# Patient Record
Sex: Male | Born: 1995 | Race: Black or African American | Hispanic: No | Marital: Single
Health system: Southern US, Community
[De-identification: ages and names within clinical notes are randomized; demographics above are authoritative.]

## PROBLEM LIST (undated history)

## (undated) DIAGNOSIS — I471 Supraventricular tachycardia, unspecified: Secondary | ICD-10-CM

---
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
## 2014-07-27 ENCOUNTER — Ambulatory Visit (INDEPENDENT_AMBULATORY_CARE_PROVIDER_SITE_OTHER): Payer: BLUE CROSS/BLUE SHIELD

## 2014-07-27 ENCOUNTER — Ambulatory Visit (INDEPENDENT_AMBULATORY_CARE_PROVIDER_SITE_OTHER): Payer: BLUE CROSS/BLUE SHIELD | Admitting: Emergency Medicine

## 2014-07-27 VITALS — BP 122/82 | HR 120 | Temp 98.8°F | Resp 16 | Ht 70.0 in | Wt 147.0 lb

## 2014-07-27 DIAGNOSIS — I471 Supraventricular tachycardia: Secondary | ICD-10-CM

## 2014-07-27 DIAGNOSIS — R1012 Left upper quadrant pain: Secondary | ICD-10-CM

## 2014-07-27 DIAGNOSIS — K5900 Constipation, unspecified: Secondary | ICD-10-CM

## 2014-07-27 LAB — POCT CBC
Granulocyte percent: 50.1 %G (ref 37–80)
HCT, POC: 49.4 % (ref 43.5–53.7)
Hemoglobin: 16 g/dL (ref 14.1–18.1)
Lymph, poc: 1.9 (ref 0.6–3.4)
MCH, POC: 29.3 pg (ref 27–31.2)
MCHC: 32.5 g/dL (ref 31.8–35.4)
MCV: 90.3 fL (ref 80–97)
MID (cbc): 0.3 (ref 0–0.9)
MPV: 6.6 fL (ref 0–99.8)
POC GRANULOCYTE: 2.2 (ref 2–6.9)
POC LYMPH PERCENT: 42.2 %L (ref 10–50)
POC MID %: 7.7 %M (ref 0–12)
Platelet Count, POC: 338 10*3/uL (ref 142–424)
RBC: 5.47 M/uL (ref 4.69–6.13)
RDW, POC: 14.9 %
WBC: 4.4 10*3/uL — AB (ref 4.6–10.2)

## 2014-07-27 LAB — POCT URINALYSIS DIPSTICK
Bilirubin, UA: NEGATIVE
Glucose, UA: NEGATIVE
Ketones, UA: NEGATIVE
Leukocytes, UA: NEGATIVE
Nitrite, UA: NEGATIVE
Protein, UA: NEGATIVE
Spec Grav, UA: <=1.005
Urobilinogen, UA: 0.2
pH, UA: 5

## 2014-07-27 LAB — POCT UA - MICROSCOPIC ONLY
Bacteria, U Microscopic: NEGATIVE
CASTS, UR, LPF, POC: NEGATIVE
Crystals, Ur, HPF, POC: NEGATIVE
Mucus, UA: NEGATIVE
RBC, urine, microscopic: NEGATIVE
WBC, Ur, HPF, POC: NEGATIVE
Yeast, UA: NEGATIVE

## 2014-07-27 MED ORDER — POLYETHYLENE GLYCOL 3350 17 GM/SCOOP PO POWD: 17.0000 g | Freq: Two times a day (BID) | ORAL | Status: DC | PRN

## 2014-07-27 NOTE — Patient Instructions (Signed)

## 2014-07-27 NOTE — Progress Notes (Signed)
[facility] [address], [place] [address] [phone]  Date:  07/27/2014   Name:  Wesley Morales Caswell   DOB:  18-Jan-1996   MRN:  [id number]  PCP:  No primary care provider on file.    Chief Complaint: Abdominal Pain   History of Present Illness:  Wesley Morales Budge is a 19 y.o. very pleasant male patient who presents with the following:  Has a less than one week history of LUQ pain. No precipitating factor. Worse with meals.  No alcohol  Lots of caffeine. Vomiting on Saturday.  No nausea or food intolerance. The patient has no complaint of blood, mucous, or pus in her stools. No vomiting blood.no cough or coryza. No shortness of breath or hemoptysis ArchivistCollege student. Denies other complaint or health concern today.   Patient Active Problem List   Diagnosis Date Noted  . 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 No pertinent past medical history.  History reviewed. No pertinent past surgical history.  History  Substance Use Topics  . Smoking status: Never Smoker   . Smokeless tobacco: Not on file  . Alcohol Use: Not on file    History reviewed. No pertinent family history.  No Known Allergies  Medication list has been reviewed and updated.  No current outpatient prescriptions on file prior to visit.   No current facility-administered medications on file prior to visit.    Review of Systems:  As per HPI, otherwise negative.    Physical Examination: Filed Vitals:   07/27/14 1645  BP: 122/82  Pulse: 120  Temp: 98.8 F (37.1 C)  Resp: 16   Filed Vitals:   07/27/14 1645  Height: 5\' 10"  (1.778 m)  Weight: 147 lb (66.679 kg)   Body mass index is 21.09 kg/(m^2). Ideal Body Weight: Weight in (lb) to have BMI = 25: 173.9  GEN: WDWN, NAD, Non-toxic, A & O x 3 HEENT: Atraumatic, Normocephalic. Neck supple. No masses, No LAD. Ears and Nose: No external deformity. CV: SVT, No M/G/R. No JVD. No thrill. No  extra heart sounds. PULM: CTA B, no wheezes, crackles, rhonchi. No retractions. No resp. distress. No accessory muscle use. ABD: S, NT, ND, +BS. No rebound. No HSM. EXTR: No c/c/e NEURO Normal gait.  PSYCH: Normally interactive. Conversant. Not depressed or anxious appearing.  Calm demeanor.    Assessment and Plan: Abdominal pain Constipation PSVT  Signed,  Phillips OdorJeffery Tomara Youngberg, MD   [facility] reading (PRIMARY) by  Dr. Dareen PianoAnderson:  No SBO or free air.

## 2014-07-28 LAB — COMPREHENSIVE METABOLIC PANEL
ALT: 55 U/L — AB (ref 0–53)
AST: 34 U/L (ref 0–37)
Albumin: 4.7 g/dL (ref 3.5–5.2)
Alkaline Phosphatase: 85 U/L (ref 39–117)
BUN: 9 mg/dL (ref 6–23)
CO2: 27 meq/L (ref 19–32)
Calcium: 9.6 mg/dL (ref 8.4–10.5)
Chloride: 104 mEq/L (ref 96–112)
Creat: 0.9 mg/dL (ref 0.50–1.35)
Glucose, Bld: 77 mg/dL (ref 70–99)
Potassium: 4.4 mEq/L (ref 3.5–5.3)
Sodium: 139 mEq/L (ref 135–145)
Total Bilirubin: 0.4 mg/dL (ref 0.2–1.1)
Total Protein: 7.3 g/dL (ref 6.0–8.3)

## 2014-07-28 LAB — TSH: TSH: 1.466 u[IU]/mL (ref 0.350–4.500)

## 2014-12-20 ENCOUNTER — Ambulatory Visit (INDEPENDENT_AMBULATORY_CARE_PROVIDER_SITE_OTHER): Payer: Commercial Managed Care - PPO | Admitting: Family Medicine

## 2014-12-20 VITALS — BP 128/70 | HR 105 | Temp 98.2°F | Resp 16 | Ht 71.0 in | Wt 146.8 lb

## 2014-12-20 DIAGNOSIS — Z113 Encounter for screening for infections with a predominantly sexual mode of transmission: Secondary | ICD-10-CM

## 2014-12-20 DIAGNOSIS — Z202 Contact with and (suspected) exposure to infections with a predominantly sexual mode of transmission: Secondary | ICD-10-CM

## 2014-12-20 DIAGNOSIS — R300 Dysuria: Secondary | ICD-10-CM

## 2014-12-20 DIAGNOSIS — J3089 Other allergic rhinitis: Secondary | ICD-10-CM

## 2014-12-20 LAB — POCT UA - MICROSCOPIC ONLY
Bacteria, U Microscopic: NEGATIVE
CASTS, UR, LPF, POC: NEGATIVE
CRYSTALS, UR, HPF, POC: NEGATIVE
EPITHELIAL CELLS, URINE PER MICROSCOPY: NEGATIVE
Mucus, UA: NEGATIVE
RBC, urine, microscopic: NEGATIVE
WBC, Ur, HPF, POC: NEGATIVE
Yeast, UA: NEGATIVE

## 2014-12-20 LAB — POCT URINALYSIS DIPSTICK
Bilirubin, UA: NEGATIVE
Blood, UA: NEGATIVE
GLUCOSE UA: NEGATIVE
Ketones, UA: NEGATIVE
Leukocytes, UA: NEGATIVE
NITRITE UA: NEGATIVE
PH UA: 6.5
PROTEIN UA: NEGATIVE
Spec Grav, UA: 1.025
Urobilinogen, UA: 0.2

## 2014-12-20 MED ORDER — TRIAMCINOLONE ACETONIDE 55 MCG/ACT NA AERO: 2.0000 | INHALATION_SPRAY | Freq: Every day | NASAL | Status: DC

## 2014-12-20 MED ORDER — CEFTRIAXONE SODIUM 1 G IJ SOLR
250.0000 mg | Freq: Once | INTRAMUSCULAR | Status: AC
  Administered 2014-12-20: 250 mg via INTRAMUSCULAR

## 2014-12-20 MED ORDER — AZITHROMYCIN 250 MG PO TABS: ORAL_TABLET | ORAL | Status: DC

## 2014-12-20 NOTE — Patient Instructions (Signed)
I will be in touch with your labs asap We treated you for gonorrhea today (shot) and gave you pills to cover chlamydia (azithromycin) Try the nasal spray for your nasal congestion- let me know if not helpful

## 2014-12-20 NOTE — Progress Notes (Addendum)
[facility] [address], [place] [address] [phone]  Date:  12/20/2014   Name:  Wesley Morales   DOB:  [birth year]   MRN:  [id number]  PCP:  No primary care provider on file.    Chief Complaint: Exposure to STD   History of Present Illness:  Wesley Morales is a 19 y.o. very pleasant male patient who presents with the following:  Here today with concern about STI.  He notes some dysuria and a little bit of penile discharge yesterday.   His former partner was unfaithful- he had gonorrhea last year.  He has not had any unprotected vaginal sex but has had oral sex.  Last sexual encounter 1.5 weeks ago He has the dysuria still today but no discharge today He would like to go ahead and be treated for gonorrhea and chlamydia today He is generally healthy   He has a history of PSVT- has had this since childhood. OW healthy  He also notes nasal congestion that bothers him more at night.  He tried some zicam NS but nothing else so far  Patient Active Problem List   Diagnosis Date Noted  . 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 No pertinent past medical history.  History reviewed. No pertinent past surgical history.  History  Substance Use Topics  . Smoking status: Never Smoker   . Smokeless tobacco: Not on file  . Alcohol Use: Not on file    History reviewed. No pertinent family history.  No Known Allergies  Medication list has been reviewed and updated.  Current Outpatient Prescriptions on File Prior to Visit  Medication Sig Dispense Refill  . polyethylene glycol powder (GLYCOLAX/MIRALAX) powder Take 17 g by mouth 2 (two) times daily as needed. (Patient not taking: Reported on 12/20/2014) 3350 g 1   No current facility-administered medications on file prior to visit.    Review of Systems:  As per HPI- otherwise negative.   Physical Examination: Filed Vitals:   12/20/14 1326  BP: 128/70  Pulse: 105   Temp: 98.2 F (36.8 C)  Resp: 16   Filed Vitals:   12/20/14 1326  Height: 5\' 11"  (1.803 m)  Weight: 105 lb (47.628 kg)   Body mass index is 14.65 kg/(m^2). Ideal Body Weight: Weight in (lb) to have BMI = 25: 178.9  GEN: WDWN, NAD, Non-toxic, A & O x 3, slim build, looks well HEENT: Atraumatic, Normocephalic. Neck supple. No masses, No LAD. Ears and Nose: No external deformity. CV: RRR, No M/G/R. No JVD. No thrill. No extra heart sounds. PULM: CTA B, no wheezes, crackles, rhonchi. No retractions. No resp. distress. No accessory muscle use. Denies any genital lesion and declines exam today. EXTR: No c/c/e NEURO Normal gait.  PSYCH: Normally interactive. Conversant. Not depressed or anxious appearing.  Calm demeanor.   Please note weight above is incorrect- corrected in vitals section    Assessment and Plan: STD exposure - Plan: azithromycin (ZITHROMAX) 250 MG tablet, cefTRIAXone (ROCEPHIN) injection 250 mg  Screening for STD (sexually transmitted disease) - Plan: GC/Chlamydia Probe Amp, HIV antibody, Hepatitis B surface antibody, Hepatitis B surface antigen, Hepatitis C antibody, RPR  Dysuria - Plan: Urine culture, POCT UA - Microscopic Only, POCT urinalysis dipstick  Other allergic rhinitis - Plan: triamcinolone (NASACORT) 55 MCG/ACT AERO nasal inhaler  Treat for possible gonorrhea/ chlamydia exposure as above Other labs pending Will be in touch with him asap Reminded not to have intercourse until  we get his results in  Meds ordered this encounter  Medications  . azithromycin (ZITHROMAX) 250 MG tablet    Sig: Take 4 pills (1gram) once    Dispense:  4 tablet    Refill:  0  . cefTRIAXone (ROCEPHIN) injection 250 mg    Sig:     Order Specific Question:  Antibiotic Indication:    Answer:  STD  . triamcinolone (NASACORT) 55 MCG/ACT AERO nasal inhaler    Sig: Place 2 sprays into the nose daily.    Dispense:  1 Inhaler    Refill:  12    Signed Abbe Amsterdam,  MD Received labs 7/13, called and let him know. He states that he is feeling a lot better.  Advised that he is not immune to hep b, can be immunized against this at his convenience   Results for orders placed or performed in visit on 12/20/14  GC/Chlamydia Probe Amp  Result Value Ref Range   CT Probe RNA NEGATIVE    GC Probe RNA NEGATIVE   HIV antibody  Result Value Ref Range   HIV 1&2 Ab, 4th Generation NONREACTIVE NONREACTIVE  Hepatitis B surface antibody  Result Value Ref Range   Hepatitis B-Post 0.2 mIU/mL  Hepatitis B surface antigen  Result Value Ref Range   Hepatitis B Surface Ag NEGATIVE NEGATIVE  Hepatitis C antibody  Result Value Ref Range   HCV Ab NEGATIVE NEGATIVE  RPR  Result Value Ref Range   RPR Ser Ql NON REAC NON REAC  POCT UA - Microscopic Only  Result Value Ref Range   WBC, Ur, HPF, POC neg    RBC, urine, microscopic neg    Bacteria, U Microscopic neg    Mucus, UA neg    Epithelial cells, urine per micros neg    Crystals, Ur, HPF, POC neg    Casts, Ur, LPF, POC neg    Yeast, UA neg   POCT urinalysis dipstick  Result Value Ref Range   Color, UA yellow    Clarity, UA clear    Glucose, UA neg    Bilirubin, UA neg    Ketones, UA neg    Spec Grav, UA 1.025    Blood, UA neg    pH, UA 6.5    Protein, UA neg    Urobilinogen, UA 0.2    Nitrite, UA neg    Leukocytes, UA Negative Negative

## 2014-12-21 LAB — HEPATITIS B SURFACE ANTIGEN: HEP B S AG: NEGATIVE

## 2014-12-21 LAB — RPR

## 2014-12-21 LAB — HEPATITIS C ANTIBODY: HCV Ab: NEGATIVE

## 2014-12-21 LAB — HEPATITIS B SURFACE ANTIBODY, QUANTITATIVE: HEPATITIS B-POST: 0.2 m[IU]/mL

## 2014-12-21 LAB — HIV ANTIBODY (ROUTINE TESTING W REFLEX): HIV 1&2 Ab, 4th Generation: NONREACTIVE

## 2014-12-21 LAB — GC/CHLAMYDIA PROBE AMP
CT PROBE, AMP APTIMA: NEGATIVE
GC PROBE AMP APTIMA: NEGATIVE

## 2014-12-22 LAB — URINE CULTURE
Colony Count: NO GROWTH
ORGANISM ID, BACTERIA: NO GROWTH

## 2016-11-06 ENCOUNTER — Emergency Department (HOSPITAL_BASED_OUTPATIENT_CLINIC_OR_DEPARTMENT_OTHER): Payer: Commercial Managed Care - PPO

## 2016-11-06 ENCOUNTER — Emergency Department (HOSPITAL_BASED_OUTPATIENT_CLINIC_OR_DEPARTMENT_OTHER)
Admission: EM | Admit: 2016-11-06 | Discharge: 2016-11-06 | Disposition: A | Discharge status: Home / Self Care | Payer: Commercial Managed Care - PPO | Attending: Emergency Medicine | Admitting: Emergency Medicine

## 2016-11-06 ENCOUNTER — Encounter (HOSPITAL_BASED_OUTPATIENT_CLINIC_OR_DEPARTMENT_OTHER): Payer: Self-pay | Admitting: *Deleted

## 2016-11-06 DIAGNOSIS — R079 Chest pain, unspecified: Secondary | ICD-10-CM | POA: Diagnosis present

## 2016-11-06 DIAGNOSIS — R000 Tachycardia, unspecified: Secondary | ICD-10-CM | POA: Diagnosis not present

## 2016-11-06 DIAGNOSIS — E860 Dehydration: Secondary | ICD-10-CM | POA: Insufficient documentation

## 2016-11-06 HISTORY — DX: Supraventricular tachycardia, unspecified: I47.10

## 2016-11-06 HISTORY — DX: Supraventricular tachycardia: I47.1

## 2016-11-06 LAB — BASIC METABOLIC PANEL
Anion gap: 11 (ref 5–15)
BUN: 14 mg/dL (ref 6–20)
CHLORIDE: 101 mmol/L (ref 101–111)
CO2: 27 mmol/L (ref 22–32)
CREATININE: 0.86 mg/dL (ref 0.61–1.24)
Calcium: 9.6 mg/dL (ref 8.9–10.3)
GFR calc Af Amer: >60 mL/min (ref >60)
GFR calc non Af Amer: >60 mL/min (ref >60)
Glucose, Bld: 100 mg/dL — ABNORMAL HIGH (ref 65–99)
Potassium: 4.7 mmol/L (ref 3.5–5.1)
Sodium: 139 mmol/L (ref 135–145)

## 2016-11-06 LAB — CBC
HEMATOCRIT: 47.1 % (ref 39.0–52.0)
HEMOGLOBIN: 16.6 g/dL (ref 13.0–17.0)
MCH: 30.5 pg (ref 26.0–34.0)
MCHC: 35.2 g/dL (ref 30.0–36.0)
MCV: 86.4 fL (ref 78.0–100.0)
Platelets: 372 10*3/uL (ref 150–400)
RBC: 5.45 MIL/uL (ref 4.22–5.81)
RDW: 13.4 % (ref 11.5–15.5)
WBC: 8.9 10*3/uL (ref 4.0–10.5)

## 2016-11-06 LAB — TROPONIN I: Troponin I: <0.03 ng/mL (ref <0.03)

## 2016-11-06 LAB — D-DIMER, QUANTITATIVE: D-Dimer, Quant: 0.29 ug/mL-FEU (ref 0.00–0.50)

## 2016-11-06 LAB — TSH: TSH: 0.412 u[IU]/mL (ref 0.350–4.500)

## 2016-11-06 MED ORDER — SODIUM CHLORIDE 0.9 % IV BOLUS (SEPSIS)
1000.0000 mL | Freq: Once | INTRAVENOUS | Status: AC
  Administered 2016-11-06: 1000 mL via INTRAVENOUS

## 2016-11-06 NOTE — ED Notes (Signed)
ED Provider at bedside. 

## 2016-11-06 NOTE — ED Notes (Signed)
Patient transported to X-ray 

## 2016-11-06 NOTE — ED Notes (Signed)
Pt on monitor 

## 2016-11-06 NOTE — Discharge Instructions (Signed)
Please call Dr. Prescott GumBensimhon's office to schedule a follow up appointment from today's visit. If you develop new or worsening symptoms including fever, chills, worsening shortness of breath, numbness, or tingling, please return to the Emergency Department for re-evaluation.

## 2016-11-06 NOTE — ED Provider Notes (Signed)
[facility]   CSN: [id number] Arrival date & time: 11/06/16  1541  By signing my name below, I, Modena Jansky, attest that this documentation has been prepared under the direction and in the presence of non-physician practitioner, Frederik Pear, PA-C. Electronically Signed: Modena Jansky, Scribe. 11/06/2016. 4:12 PM.  History   Chief Complaint Chief Complaint  Patient presents with  . Chest Pain   The history is provided by the patient. No language interpreter was used.   HPI Comments: Wesley Morales is a 21 y.o. male with a PMHx of SVT who presents to the Emergency Department complaining of intermittent moderate left-sided chest pain that started about 3 hours ago. He states he started having a cough (productive of mucus), SOB (exertional), and rhinorrhea a few days ago. He was prescribed Norel AD without improvement. No medication taken PTA today. His chest pain is relieved by laying down and exacerbated by nothing. He describes the pain as a sharp sensation with pressure. He reports he has been on Digoxin for his SVT 6-7 years but was taken off medication for the past few years after the problem resolved. He reports associated chills, heart palpitations (intermittent, currently improved), and headache. He had 2 episodes of vomiting today and his nausea is currently improved. He reports intermittent cramping left thigh pain, lasting 1-2 minutes, which has now improved. Last BM: yesterday. Denies any prior hx of similar complaint, hx of PE/DVT, family hx of PE/DVT, leg swelling, abdominal pain, dysuria, rash, or other complaints at this time.  Cardiologist: Dr. Ace Gins   Past Medical History:  Diagnosis Date  . SVT (supraventricular tachycardia) [facility])     Patient Active Problem List   Diagnosis Date Noted  . 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 No pertinent surgical history.   Home Medications    Prior to Admission  medications   Medication Sig Start Date End Date Taking? Authorizing Provider  Chlorphen-PE-Acetaminophen (NOREL AD) 4-10-325 MG TABS Take by mouth.   Yes [provider]    Family History No family history on file.  Social History Social History  Substance Use Topics  . Smoking status: Never Smoker  . Smokeless tobacco: Not on file  . Alcohol use No     Allergies   Patient has no known allergies.   Review of Systems Review of Systems  Constitutional: Negative for activity change.  HENT: Positive for rhinorrhea.   Respiratory: Positive for cough (Productive) and shortness of breath.   Cardiovascular: Positive for chest pain and palpitations. Negative for leg swelling.  Gastrointestinal: Positive for nausea and vomiting. Negative for abdominal pain.  Genitourinary: Negative for dysuria.  Musculoskeletal: Positive for myalgias (Left thigh). Negative for back pain.  Skin: Negative for rash.  Neurological: Positive for headaches.     Physical Exam Updated Vital Signs BP 135/86   Pulse (!) 108   Temp 98.5 F (36.9 C)   Resp 16   Ht 5\' 11"  (1.803 m)   Wt 155 lb (70.3 kg)   SpO2 99%   BMI 21.62 kg/m   Physical Exam  Constitutional: He appears well-developed.  HENT:  Head: Normocephalic.  Tongue is dry.   Eyes: Conjunctivae are normal.  Neck: Neck supple.  Cardiovascular: Regular rhythm.  Tachycardia present.  Exam reveals no gallop and no friction rub.   No murmur heard. Prominent PMI.   Pulmonary/Chest: Effort normal and breath sounds normal. No respiratory distress. He has no wheezes. He has no rales. He  exhibits no tenderness.  Abdominal: Soft. Bowel sounds are normal. He exhibits no distension. There is no tenderness.  Musculoskeletal: He exhibits no edema or tenderness.  No BLE edema.   Neurological: He is alert.  Skin: Skin is warm and dry. Capillary refill takes 2 to 3 seconds.  Psychiatric: His behavior is normal.  Nursing note and vitals  reviewed.    ED Treatments / Results  DIAGNOSTIC STUDIES: Oxygen Saturation is 99% on RA, normal by my interpretation.    COORDINATION OF CARE: 4:16 PM- Pt advised of plan for treatment and pt agrees.  Labs (all labs ordered are listed, but only abnormal results are displayed) Labs Reviewed  BASIC METABOLIC PANEL - Abnormal; Notable for the following:       Result Value   Glucose, Bld 100 (*)    All other components within normal limits  TROPONIN I  CBC  D-DIMER, QUANTITATIVE (NOT AT [facility])  TSH    EKG  EKG Interpretation  Date/Time:  Wednesday Nov 06 2016 15:51:26 EDT Ventricular Rate:  106 PR Interval:    QRS Duration: 94 QT Interval:  340 QTC Calculation: 452 R Axis:   71 Text Interpretation:  Sinus tachycardia Consider right atrial enlargement No significant change since last tracing Confirmed by Gwyneth SproutPlunkett, Whitney ([id number]) on 11/06/2016 4:33:00 PM       Radiology Dg Chest 2 View  Result Date: 11/06/2016 CLINICAL DATA:  Chest pain.  Cough EXAM: CHEST  2 VIEW COMPARISON:  07/27/2014 FINDINGS: The heart size and mediastinal contours are within normal limits. Both lungs are clear. The visualized skeletal structures are unremarkable. IMPRESSION: No active cardiopulmonary disease. Electronically Signed   By: Signa Kellaylor  Stroud M.D.   On: 11/06/2016 17:26    Procedures Procedures (including critical care time)  Medications Ordered in ED Medications  sodium chloride 0.9 % bolus 1,000 mL (0 mLs Intravenous Stopped 11/06/16 1752)  sodium chloride 0.9 % bolus 1,000 mL (0 mLs Intravenous Stopped 11/06/16 1932)     Initial Impression / Assessment and Plan / ED Course  I have reviewed the triage vital signs and the nursing notes.  Pertinent labs & imaging results that were available during my care of the patient were reviewed by me and considered in my medical decision making (see chart for details).     Patient is to be discharged with recommendation to follow up with PCP  and Cardiology in regards to today's hospital visit. D-dimer is not elevated. VSS, no tracheal deviation, no JVD or new murmur, RRR, breath sounds equal bilaterally, EKG with sinus tachycardia, negative troponin, and negative CXR. Pt has been advised to return to the ED if CP becomes exertional, associated with diaphoresis or nausea, radiates to left jaw/arm, worsens or becomes concerning in any way. Patient initially appeared dehydrated and was much improved after IVF, which also improved his heart rate. Pt appears reliable for follow up and is agreeable to discharge.   Case has been discussed with Dr. Anitra LauthPlunkett, attending physician, who agrees with the above plan to discharge.    Final Clinical Impressions(s) / ED Diagnoses   Final diagnoses:  Sinus tachycardia  Dehydration    New Prescriptions Discharge Medication List as of 11/06/2016  7:56 PM    I personally performed the services described in this documentation, which was scribed in my presence. The recorded information has been reviewed and is accurate.     Frederik PearMcDonald, Kyler Lerette A, PA-C 11/08/16 0350    Gwyneth SproutPlunkett, Whitney, MD 11/08/16 [phone]

## 2016-11-06 NOTE — ED Triage Notes (Signed)
Pt c/o chest pressure, SOb and palpitations  x 3 hrs ago

## 2018-08-21 ENCOUNTER — Ambulatory Visit
Admission: EM | Admit: 2018-08-21 | Discharge: 2018-08-21 | Disposition: A | Discharge status: Home / Self Care | Payer: BLUE CROSS/BLUE SHIELD

## 2018-08-21 DIAGNOSIS — R202 Paresthesia of skin: Secondary | ICD-10-CM | POA: Diagnosis not present

## 2018-08-21 NOTE — Discharge Instructions (Signed)
No alarming signs on exam. Continue to monitor for now. Follow up with orthopedic if numbness becomes worse and does not resolve.

## 2018-08-21 NOTE — ED Triage Notes (Signed)
Pt c/o rt pinky tingling and burning while holding his phone. States needs a work note and a paper trail for [employer]. Pt denies injury or pain

## 2018-08-21 NOTE — ED Provider Notes (Signed)
[facility]    CSN: [id number] Arrival date & time: 08/21/18  1347     History   Chief Complaint Chief Complaint  Patient presents with  . Hand Pain    HPI Wesley Morales is a 23 y.o. male.   23 year old male comes in for "tingling" sensation to the lateral side of middle phalanx of the 5th right finger for a few hours. States was using his phone, had his phone propped with his pinky and felt some tingling sensation. Now with burning sensation to the area. Denies injury/trauma. Denies pain. Denies decrease in ROM. States his mother told him to come get an evaluation.      Past Medical History:  Diagnosis Date  . SVT (supraventricular tachycardia) [facility])     Patient Active Problem List   Diagnosis Date Noted  . 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 No pertinent surgical history.     Home Medications    Prior to Admission medications   Medication Sig Start Date End Date Taking? Authorizing Provider  Chlorphen-PE-Acetaminophen (NOREL AD) 4-10-325 MG TABS Take by mouth.    [provider]    Family History No family history on file.  Social History Social History   Tobacco Use  . Smoking status: Current Every Day Smoker  . Smokeless tobacco: Never Used  Substance Use Topics  . Alcohol use: No    Alcohol/week: 0.0 standard drinks  . Drug use: No     Allergies   Patient has no known allergies.   Review of Systems Review of Systems  Reason unable to perform ROS: See HPI as above.     Physical Exam Triage Vital Signs ED Triage Vitals  Enc Vitals Group     BP 08/21/18 1357 (!) 160/100     Pulse Rate 08/21/18 1357 99     Resp 08/21/18 1357 18     Temp 08/21/18 1357 98.5 F (36.9 C)     Temp Source 08/21/18 1357 Oral     SpO2 08/21/18 1357 99 %     Weight --      Height --      Head Circumference --      Peak Flow --      Pain Score 08/21/18 1358 0     Pain Loc --      Pain Edu? --       Excl. in GC? --    No data found.  Updated Vital Signs BP (!) 145/94 (BP Location: Left Arm)   Pulse 99   Temp 98.5 F (36.9 C) (Oral)   Resp 18   SpO2 99%   Physical Exam Constitutional:      General: He is not in acute distress.    Appearance: He is well-developed. He is not diaphoretic.  HENT:     Head: Normocephalic and atraumatic.  Eyes:     Conjunctiva/sclera: Conjunctivae normal.     Pupils: Pupils are equal, round, and reactive to light.  Musculoskeletal:     Comments: No erythema, warmth, swelling, rash, contusion seen. Patient states he feels that the middle phalanx feels different on palpation. No tenderness to palpation of the finger. Full ROM of finger. Strength normal and equal bilaterally. Patient states lateral inner side of middle phalanx has decreased sensation, but with 5/5 everywhere else. Able to distinguish sharp from dull.   Neurological:     Mental Status: He is alert and oriented to person, place, and time.  UC Treatments / Results  Labs (all labs ordered are listed, but only abnormal results are displayed) Labs Reviewed - No data to display  EKG None  Radiology No results found.  Procedures Procedures (including critical care time)  Medications Ordered in UC Medications - No data to display  Initial Impression / Assessment and Plan / UC Course  I have reviewed the triage vital signs and the nursing notes.  Pertinent labs & imaging results that were available during my care of the patient were reviewed by me and considered in my medical decision making (see chart for details).    No alarming signs on exam. Will have patient monitor for now. Return precautions given. Patient expresses understanding and agrees to plan.  Final Clinical Impressions(s) / UC Diagnoses   Final diagnoses:  Paresthesia of finger   ED Prescriptions    None        Belinda Fisher, PA-C 08/21/18 1421

## 2018-11-30 ENCOUNTER — Ambulatory Visit: Payer: BC Managed Care – PPO | Admitting: Pharmacist

## 2018-11-30 ENCOUNTER — Telehealth: Payer: Self-pay | Admitting: Pharmacy Technician

## 2018-11-30 NOTE — Telephone Encounter (Signed)
[facility] Patient Advocate Encounter    Findings of the benefits investigation conducted this morning via test claims for the patient's upcoming appointment on 11/30/2018 are as follows:   Insurance: BC[employer]- active commercial Estimated copay amount: [phone] (tested with [employer]) Prior Authorization: not required for this medication Deductible: after this claim $2109.08 remains  [facility] Patient Advocate will follow up once patient arrives for their appointment and obtain a copayment card to cover the cost of the medication.  Bartholomew Crews, CPhT Specialty Pharmacy Patient [facility] Phone: [phone] Fax: [phone] 11/30/2018 8:53 AM

## 2018-12-07 ENCOUNTER — Telehealth: Payer: Self-pay | Admitting: Pharmacist

## 2018-12-07 NOTE — Telephone Encounter (Signed)
COVID-19 Pre-Screening Questions: ° °Do you currently have a fever (>100 °F), chills or unexplained body aches? no  ° °Are you currently experiencing new cough, shortness of breath, sore throat, runny nose?no  °•  °Have you recently travelled outside the state of [place] in the last 14 days? no °•  °Have you been in contact with someone that is currently pending confirmation of Covid19 testing or has been confirmed to have the Covid19 virus? no °

## 2018-12-08 ENCOUNTER — Ambulatory Visit: Payer: BC Managed Care – PPO | Admitting: Pharmacist

## 2018-12-08 ENCOUNTER — Telehealth: Payer: Self-pay | Admitting: Pharmacy Technician

## 2018-12-08 NOTE — Telephone Encounter (Signed)
[facility] Patient Teacher, English as a foreign language completed.    The patient is insured through [employer] [facility] [facility] [employer] and has a $1540.08 copay.  We will continue to follow to see if copay assistance is needed.  Venida Jarvis. Nadara Mustard [place] Patient [facility] Phone: [phone] Fax:  [phone]

## 2019-04-06 DIAGNOSIS — Z20828 Contact with and (suspected) exposure to other viral communicable diseases: Secondary | ICD-10-CM | POA: Insufficient documentation

## 2019-04-06 DIAGNOSIS — R45851 Suicidal ideations: Secondary | ICD-10-CM | POA: Insufficient documentation

## 2019-04-06 DIAGNOSIS — Z79899 Other long term (current) drug therapy: Secondary | ICD-10-CM | POA: Insufficient documentation

## 2019-04-06 DIAGNOSIS — F329 Major depressive disorder, single episode, unspecified: Secondary | ICD-10-CM | POA: Insufficient documentation

## 2019-04-06 DIAGNOSIS — F1210 Cannabis abuse, uncomplicated: Secondary | ICD-10-CM | POA: Insufficient documentation

## 2019-04-06 DIAGNOSIS — F209 Schizophrenia, unspecified: Secondary | ICD-10-CM | POA: Diagnosis not present

## 2019-04-07 ENCOUNTER — Other Ambulatory Visit: Payer: Self-pay

## 2019-04-07 ENCOUNTER — Emergency Department (HOSPITAL_COMMUNITY)
Admission: EM | Admit: 2019-04-07 | Discharge: 2019-04-07 | Disposition: A | Discharge status: Other Acute Inpatient Hospital | Payer: BC Managed Care – PPO | Source: Home / Self Care | Attending: Emergency Medicine | Admitting: Emergency Medicine

## 2019-04-07 ENCOUNTER — Encounter (HOSPITAL_COMMUNITY): Payer: Self-pay

## 2019-04-07 ENCOUNTER — Encounter (HOSPITAL_COMMUNITY): Payer: Self-pay | Admitting: Emergency Medicine

## 2019-04-07 ENCOUNTER — Inpatient Hospital Stay (HOSPITAL_COMMUNITY)
Admission: AD | Admit: 2019-04-07 | Discharge: 2019-04-09 | DRG: 885 | Disposition: A | Discharge status: Home / Self Care | Payer: BC Managed Care – PPO | Source: Intra-hospital | Attending: Psychiatry | Admitting: Psychiatry

## 2019-04-07 DIAGNOSIS — F17200 Nicotine dependence, unspecified, uncomplicated: Secondary | ICD-10-CM | POA: Diagnosis present

## 2019-04-07 DIAGNOSIS — R4689 Other symptoms and signs involving appearance and behavior: Secondary | ICD-10-CM

## 2019-04-07 DIAGNOSIS — Z23 Encounter for immunization: Secondary | ICD-10-CM | POA: Diagnosis not present

## 2019-04-07 DIAGNOSIS — R45851 Suicidal ideations: Secondary | ICD-10-CM

## 2019-04-07 DIAGNOSIS — F209 Schizophrenia, unspecified: Principal | ICD-10-CM

## 2019-04-07 DIAGNOSIS — F200 Paranoid schizophrenia: Secondary | ICD-10-CM | POA: Diagnosis not present

## 2019-04-07 DIAGNOSIS — G4700 Insomnia, unspecified: Secondary | ICD-10-CM | POA: Diagnosis present

## 2019-04-07 DIAGNOSIS — Z7289 Other problems related to lifestyle: Secondary | ICD-10-CM | POA: Diagnosis not present

## 2019-04-07 DIAGNOSIS — F329 Major depressive disorder, single episode, unspecified: Secondary | ICD-10-CM | POA: Diagnosis present

## 2019-04-07 DIAGNOSIS — Z20828 Contact with and (suspected) exposure to other viral communicable diseases: Secondary | ICD-10-CM | POA: Diagnosis present

## 2019-04-07 LAB — COMPREHENSIVE METABOLIC PANEL
ALT: 28 U/L (ref 0–44)
AST: 28 U/L (ref 15–41)
Albumin: 4.9 g/dL (ref 3.5–5.0)
Alkaline Phosphatase: 56 U/L (ref 38–126)
Anion gap: 11 (ref 5–15)
BUN: 8 mg/dL (ref 6–20)
CO2: 23 mmol/L (ref 22–32)
Calcium: 9.4 mg/dL (ref 8.9–10.3)
Chloride: 103 mmol/L (ref 98–111)
Creatinine, Ser: 0.97 mg/dL (ref 0.61–1.24)
GFR calc Af Amer: >60 mL/min (ref >60)
GFR calc non Af Amer: >60 mL/min (ref >60)
Glucose, Bld: 103 mg/dL — ABNORMAL HIGH (ref 70–99)
Potassium: 3.8 mmol/L (ref 3.5–5.1)
Sodium: 137 mmol/L (ref 135–145)
Total Bilirubin: 0.9 mg/dL (ref 0.3–1.2)
Total Protein: 7.6 g/dL (ref 6.5–8.1)

## 2019-04-07 LAB — RAPID URINE DRUG SCREEN, HOSP PERFORMED
Amphetamines: NOT DETECTED
Barbiturates: NOT DETECTED
Benzodiazepines: NOT DETECTED
Cocaine: NOT DETECTED
Opiates: NOT DETECTED
Tetrahydrocannabinol: POSITIVE — AB

## 2019-04-07 LAB — CBC
HCT: 45.3 % (ref 39.0–52.0)
Hemoglobin: 15.4 g/dL (ref 13.0–17.0)
MCH: 30.6 pg (ref 26.0–34.0)
MCHC: 34 g/dL (ref 30.0–36.0)
MCV: 90.1 fL (ref 80.0–100.0)
Platelets: 361 10*3/uL (ref 150–400)
RBC: 5.03 MIL/uL (ref 4.22–5.81)
RDW: 13.6 % (ref 11.5–15.5)
WBC: 5.4 10*3/uL (ref 4.0–10.5)
nRBC: 0 % (ref 0.0–0.2)

## 2019-04-07 LAB — ETHANOL: Alcohol, Ethyl (B): <10 mg/dL (ref <10)

## 2019-04-07 LAB — RAPID HIV SCREEN (HIV 1/2 AB+AG)
HIV 1/2 Antibodies: NONREACTIVE
HIV-1 P24 Antigen - HIV24: NONREACTIVE

## 2019-04-07 LAB — RPR: RPR Ser Ql: NONREACTIVE

## 2019-04-07 LAB — SARS CORONAVIRUS 2 BY RT PCR (HOSPITAL ORDER, PERFORMED IN ~~LOC~~ HOSPITAL LAB): SARS Coronavirus 2: NEGATIVE

## 2019-04-07 LAB — ACETAMINOPHEN LEVEL: Acetaminophen (Tylenol), Serum: <10 ug/mL — ABNORMAL LOW (ref 10–30)

## 2019-04-07 LAB — SALICYLATE LEVEL: Salicylate Lvl: <7 mg/dL (ref 2.8–30.0)

## 2019-04-07 MED ORDER — HYDROXYZINE HCL 25 MG PO TABS
25.0000 mg | ORAL_TABLET | Freq: Three times a day (TID) | ORAL | Status: DC | PRN
  Administered 2019-04-08: 23:00:00 25 mg via ORAL
  Filled 2019-04-07: qty 1

## 2019-04-07 MED ORDER — IBUPROFEN 200 MG PO TABS: 600.0000 mg | ORAL_TABLET | Freq: Three times a day (TID) | ORAL | Status: DC | PRN

## 2019-04-07 MED ORDER — TRAZODONE HCL 50 MG PO TABS: 50.0000 mg | ORAL_TABLET | Freq: Every evening | ORAL | Status: DC | PRN

## 2019-04-07 MED ORDER — VITAMIN B-1 100 MG PO TABS
100.0000 mg | ORAL_TABLET | Freq: Every day | ORAL | Status: DC
  Administered 2019-04-07: 100 mg via ORAL
  Filled 2019-04-07: qty 1

## 2019-04-07 MED ORDER — LORAZEPAM 2 MG/ML IJ SOLN: 0.0000 mg | Freq: Two times a day (BID) | INTRAMUSCULAR | Status: DC

## 2019-04-07 MED ORDER — LORAZEPAM 2 MG/ML IJ SOLN: 0.0000 mg | Freq: Four times a day (QID) | INTRAMUSCULAR | Status: DC

## 2019-04-07 MED ORDER — THIAMINE HCL 100 MG/ML IJ SOLN: 100.0000 mg | Freq: Every day | INTRAMUSCULAR | Status: DC

## 2019-04-07 MED ORDER — ALUM & MAG HYDROXIDE-SIMETH 200-200-20 MG/5ML PO SUSP: 30.0000 mL | Freq: Four times a day (QID) | ORAL | Status: DC | PRN

## 2019-04-07 MED ORDER — BENZTROPINE MESYLATE 0.5 MG PO TABS
0.5000 mg | ORAL_TABLET | Freq: Two times a day (BID) | ORAL | Status: DC
  Administered 2019-04-07 – 2019-04-09 (×4): 0.5 mg via ORAL
  Filled 2019-04-07 (×10): qty 1

## 2019-04-07 MED ORDER — ALUM & MAG HYDROXIDE-SIMETH 200-200-20 MG/5ML PO SUSP: 30.0000 mL | ORAL | Status: DC | PRN

## 2019-04-07 MED ORDER — INFLUENZA VAC SPLIT QUAD 0.5 ML IM SUSY
0.5000 mL | PREFILLED_SYRINGE | INTRAMUSCULAR | Status: AC
  Administered 2019-04-08: 0.5 mL via INTRAMUSCULAR
  Filled 2019-04-07: qty 0.5

## 2019-04-07 MED ORDER — MAGNESIUM HYDROXIDE 400 MG/5ML PO SUSP: 30.0000 mL | Freq: Every day | ORAL | Status: DC | PRN

## 2019-04-07 MED ORDER — LORAZEPAM 1 MG PO TABS: 0.0000 mg | ORAL_TABLET | Freq: Four times a day (QID) | ORAL | Status: DC

## 2019-04-07 MED ORDER — TRAZODONE HCL 150 MG PO TABS
150.0000 mg | ORAL_TABLET | Freq: Every day | ORAL | Status: DC
  Administered 2019-04-08: 150 mg via ORAL
  Filled 2019-04-07 (×4): qty 1

## 2019-04-07 MED ORDER — OMEGA-3-ACID ETHYL ESTERS 1 G PO CAPS
1.0000 g | ORAL_CAPSULE | Freq: Two times a day (BID) | ORAL | Status: DC
  Administered 2019-04-07 – 2019-04-09 (×4): 1 g via ORAL
  Filled 2019-04-07 (×10): qty 1

## 2019-04-07 MED ORDER — RISPERIDONE 2 MG PO TABS
2.0000 mg | ORAL_TABLET | Freq: Two times a day (BID) | ORAL | Status: DC
  Administered 2019-04-07 – 2019-04-08 (×2): 2 mg via ORAL
  Filled 2019-04-07 (×6): qty 1

## 2019-04-07 MED ORDER — NICOTINE 21 MG/24HR TD PT24
21.0000 mg | MEDICATED_PATCH | Freq: Every day | TRANSDERMAL | Status: DC
  Administered 2019-04-07: 11:00:00 21 mg via TRANSDERMAL
  Filled 2019-04-07: qty 1

## 2019-04-07 MED ORDER — ACETAMINOPHEN 325 MG PO TABS: 650.0000 mg | ORAL_TABLET | Freq: Four times a day (QID) | ORAL | Status: DC | PRN

## 2019-04-07 MED ORDER — ZOLPIDEM TARTRATE 5 MG PO TABS
5.0000 mg | ORAL_TABLET | Freq: Every evening | ORAL | Status: DC | PRN
  Administered 2019-04-07: 5 mg via ORAL
  Filled 2019-04-07: qty 1

## 2019-04-07 MED ORDER — LORAZEPAM 1 MG PO TABS: 0.0000 mg | ORAL_TABLET | Freq: Two times a day (BID) | ORAL | Status: DC

## 2019-04-07 NOTE — ED Notes (Signed)
Transported to [facility] by [employer]. Pt calm and cooperative.

## 2019-04-07 NOTE — BH Assessment (Addendum)
Tele Assessment Note   Patient Name: Wesley Morales MRN: [id number] Referring Physician: Frederik Pear, PA-C Location of Patient: [facility] Location of Provider: Behavioral Health TTS Department  Wesley Morales is an 23 y.o. male presenting under from home via sheriff. Mother reported patient was wandering out in street with no clothes, confused. Patient admitted to [facility] with plan to cut self with knife. Patient reported onset of SI was 2 months ago, unable to identify triggers. Patient owns a pocket knife. Mother reported that patient was just released from a [facility] on 3 weeks ago due to SI. Triage reported patient ran into glass door out in lobby and repeatedly hit head on glass. During assessment patient appeared to be confused and unable to answer questions, continually stating "I have issues, I was upset and tired, I just need to sleep, I have to adjust to everything". Patient unable to share what he needed to adjust to. Patient reported alcohol and marijuana usage. Patient reported seeing [facility] for medication management and outpatient therapy, however patient was not able to share appt times or names. Patient denied prior suicide attempt and reported self-harming behaviors of cutting himself last month. Patient resides with mother and sister. Patient was pleasant and cooperative during assessment.   Diagnosis: Major depressive disorder and Cannabis Use disorder  Past Medical History:  Past Medical History:  Diagnosis Date  . SVT (supraventricular tachycardia) (HCC)     History reviewed. No pertinent surgical history.  Family History: No family history on file.  Social History:  reports that he has been smoking. He has never used smokeless tobacco. He reports that he does not drink alcohol or use drugs.  Additional Social History:  Alcohol / Drug Use Pain Medications: see MAR Prescriptions: see MAR Over the Counter: see MAR  CIWA: CIWA-Ar BP: (!) 136/96 Pulse  Rate: (!) 120 Nausea and Vomiting: no nausea and no vomiting Tactile Disturbances: none Tremor: no tremor Auditory Disturbances: not present Paroxysmal Sweats: no sweat visible Visual Disturbances: not present Anxiety: no anxiety, at ease Headache, Fullness in Head: none present Agitation: normal activity Orientation and Clouding of Sensorium: oriented and can do serial additions CIWA-Ar Total: 0 COWS:    Allergies: No Known Allergies  Home Medications: (Not in a hospital admission)   OB/GYN Status:  No LMP for male patient.  General Assessment Data Location of Assessment: [facility] TTS Assessment: In system Is this a Tele or Face-to-Face Assessment?: Tele Assessment Is this an Initial Assessment or a Re-assessment for this encounter?: Initial Assessment Patient Accompanied by:: N/A Language Other than English: No Living Arrangements: (family home) What gender do you identify as?: Male Marital status: Single Living Arrangements: Parent, Other relatives Can pt return to current living arrangement?: Yes Admission Status: Voluntary Is patient capable of signing voluntary admission?: Yes Referral Source: Self/Family/Friend  Crisis Care Plan Living Arrangements: Parent, Other relatives Legal Guardian: (self) Name of Psychiatrist: ([facility]) Name of Therapist: ([facility])  Education Status Is patient currently in school?: No Is the patient employed, unemployed or receiving disability?: Unemployed  Risk to self with the past 6 months Suicidal Ideation: Yes-Currently Present Has patient been a risk to self within the past 6 months prior to admission? : Yes Suicidal Intent: Yes-Currently Present Has patient had any suicidal intent within the past 6 months prior to admission? : Yes Is patient at risk for suicide?: Yes Suicidal Plan?: Yes-Currently Present Has patient had any suicidal plan within the past 6 months prior to admission? :  Yes Specify Current Suicidal Plan: (SI with  plan to cut self) Access to Means: Yes Specify Access to Suicidal Means: (owns pocket knife) What has been your use of drugs/alcohol within the last 12 months?: (alcohol and marijuana) Previous Attempts/Gestures: No How many times?: (0) Other Self Harm Risks: (none reported) Triggers for Past Attempts: (n/a) Intentional Self Injurious Behavior: Cutting Comment - Self Injurious Behavior: (cut last month) Family Suicide History: No Recent stressful life event(s): (unable to assess) Persecutory voices/beliefs?: No Depression: Yes Depression Symptoms: Tearfulness, Isolating, Fatigue, Guilt, Loss of interest in usual pleasures, Feeling worthless/self pity Substance abuse history and/or treatment for substance abuse?: No Suicide prevention information given to non-admitted patients: Not applicable  Risk to Others within the past 6 months Homicidal Ideation: No Does patient have any lifetime risk of violence toward others beyond the six months prior to admission? : No Thoughts of Harm to Others: No Current Homicidal Intent: No Current Homicidal Plan: No Access to Homicidal Means: No Identified Victim: (n/a) History of harm to others?: No Assessment of Violence: None Noted Violent Behavior Description: (none reported) Does patient have access to weapons?: Yes (Comment)(pocket knife) Criminal Charges Pending?: No Does patient have a court date: No Is patient on probation?: No  Psychosis Hallucinations: None noted Delusions: None noted  Mental Status Report Appearance/Hygiene: Unremarkable Eye Contact: Fair Motor Activity: Freedom of movement Speech: Soft, Slow Level of Consciousness: Drowsy, Sleeping, Quiet/awake Mood: Depressed Affect: Depressed, Appropriate to circumstance Anxiety Level: Moderate Thought Processes: Relevant Judgement: Impaired Orientation: Person, Place, Time, Situation Obsessive Compulsive Thoughts/Behaviors: Moderate(per history)  Cognitive  Functioning Concentration: Good Memory: Recent Intact Is patient IDD: No Insight: Poor Impulse Control: Poor Appetite: Good Have you had any weight changes? : No Change Sleep: Decreased(5-6) Total Hours of Sleep: (5) Vegetative Symptoms: None  ADLScreening [facility] Assessment Services) Patient's cognitive ability adequate to safely complete daily activities?: Yes Patient able to express need for assistance with ADLs?: Yes Independently performs ADLs?: Yes (appropriate for developmental age)  Prior Inpatient Therapy Prior Inpatient Therapy: Yes Prior Therapy Dates: (3 weeks ago) Prior Therapy Facilty/Provider(s): [facility]) Reason for Treatment: (depression)  Prior Outpatient Therapy Prior Outpatient Therapy: No Does patient have an ACCT team?: No Does patient have Intensive In-House Services?  : No Does patient have [facility] services? : No Does patient have P4CC services?: No  ADL Screening (condition at time of admission) Patient's cognitive ability adequate to safely complete daily activities?: Yes Patient able to express need for assistance with ADLs?: Yes Independently performs ADLs?: Yes (appropriate for developmental age)  Regulatory affairs officer (For Healthcare) Does Patient Have a Medical Advance Directive?: No  Child/Adolescent Assessment Running Away Risk: Admits Running Away Risk as evidence by: ranaway today Bed-Wetting: Denies Destruction of Property: Admits Destruction of Porperty As Evidenced By: (moms home) Cruelty to Animals: Denies Stealing: Denies Rebellious/Defies Authority: Science writer as Evidenced By: (does whatever he wants to do) Satanic Involvement: Denies Science writer: Denies  Disposition:  Disposition Initial Assessment Completed for this Encounter: Yes  Talbot Grumbling, NP patient meets inpatient criteria. Per [facility], patient accepted to [place] pending resulted COVID test and medical clearance.   This service was provided via  telemedicine using a 2-way, interactive audio and video technology.  Names of all persons participating in this telemedicine service and their role in this encounter. Name: Wesley Morales Role: Patient  Name: Kirtland Bouchard Role: TTS Clinician  Name:  Role:   Name:  Role:     Venora Maples 04/07/2019 4:06  AM

## 2019-04-07 NOTE — ED Provider Notes (Signed)
[facility] DEPT Provider Note   CSN: [id number] Arrival date & time: 04/06/19  2344     History   Chief Complaint Chief Complaint  Patient presents with  . Medical Clearance    HPI Wesley Morales is a 23 y.o. male who presents to the ER with a chief complaint of erratic behavior and SI.  The patient reports that he was recently discharged from inpatient behavioral health in [place].  States that he was diagnosed with obsessive-compulsive disorder.  He states that until the last 2 to 3 weeks that he has never experienced SI, including prior to his inpatient behavioral health admission in [place].  He denies a specific plan.  He denies HI or auditory or visual hallucinations.  After receiving permission from the patient to receive collateral information from his mother, I spoke to the patient's mother. She states he was admitted [facility] for 5 days about 3-4 weeks after having SI. Seemed to be doing better initially after discharge, but has ulitimately been doing worse the last 2 days.   Reports he hasn't taken his remeron in the last 2 days. Came to mom's house for the first time today in 2.5 weeks.  Initially his behavior seemed appropriate, but he then seemed to "flip a switch" and was acting erratic. She states he was mumbling and talking to himself after mom told him he couldn't vape in the house. Went outside and took off shirt and shoes and took off walking. Mom called 911 to help find him after he took off down the street.  The patient's mother reports that he was living in [place], but he has no place to return to.  He will need to relocate to [facility], but she expresses concern about having him move into her home because the patient's sister has mental health concerns as well and has been doing much better.  She is concerned that the patient's presence will be a negative impact on the patient's sister.  He reports that he vapes  tobacco.  He also reports that he smokes marijuana.  He reports frequent, heavy alcohol use, but does not elaborate on the specific amount.  He denies other illicit or recreational drug use.     The history is provided by the patient. No language interpreter was used.    Past Medical History:  Diagnosis Date  . SVT (supraventricular tachycardia) [facility])     Patient Active Problem List   Diagnosis Date Noted  . 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 No pertinent surgical history.      Home Medications    Prior to Admission medications   Medication Sig Start Date End Date Taking? Authorizing Provider  mirtazapine (REMERON) 15 MG tablet Take 15 mg by mouth at bedtime. 03/23/19  Yes [provider]    Family History No family history on file.  Social History Social History   Tobacco Use  . Smoking status: Current Every Day Smoker  . Smokeless tobacco: Never Used  Substance Use Topics  . Alcohol use: No    Alcohol/week: 0.0 standard drinks  . Drug use: No     Allergies   Patient has no known allergies.   Review of Systems Review of Systems  Constitutional: Negative for appetite change and fever.  Respiratory: Negative for shortness of breath.   Cardiovascular: Negative for chest pain.  Gastrointestinal: Negative for abdominal pain, diarrhea, nausea and vomiting.  Genitourinary: Negative for dysuria.  Musculoskeletal: Negative  for back pain, joint swelling, myalgias and neck stiffness.  Skin: Negative for rash.  Allergic/Immunologic: Negative for immunocompromised state.  Neurological: Negative for headaches.  Psychiatric/Behavioral: Positive for suicidal ideas. Negative for confusion, dysphoric mood, hallucinations and self-injury. The patient is not nervous/anxious and is not hyperactive.      Physical Exam Updated Vital Signs BP 117/62 (BP Location: Left Arm)   Pulse 71   Temp 98 F (36.7 C)   Resp 18   Ht 5'  11" (1.803 m)   Wt 65.8 kg   SpO2 99%   BMI 20.22 kg/m   Physical Exam Vitals signs and nursing note reviewed.  Constitutional:      Appearance: He is well-developed.  HENT:     Head: Normocephalic.  Eyes:     Extraocular Movements: Extraocular movements intact.     Conjunctiva/sclera: Conjunctivae normal.     Pupils: Pupils are equal, round, and reactive to light.     Comments: Mild periorbital swelling of the left eye.  No subconjunctival hemorrhage.  The eye is not injected.  No drainage.  No tenderness to palpation, redness, or warmth.  Neck:     Musculoskeletal: Neck supple.  Cardiovascular:     Rate and Rhythm: Normal rate and regular rhythm.     Pulses: Normal pulses.     Heart sounds: Normal heart sounds. No murmur. No friction rub. No gallop.   Pulmonary:     Effort: Pulmonary effort is normal. No respiratory distress.     Breath sounds: No stridor. No wheezing, rhonchi or rales.  Chest:     Chest wall: No tenderness.  Abdominal:     General: There is no distension.     Palpations: Abdomen is soft. There is no mass.     Tenderness: There is no abdominal tenderness. There is no right CVA tenderness, left CVA tenderness, guarding or rebound.     Hernia: No hernia is present.  Skin:    General: Skin is warm and dry.  Neurological:     Mental Status: He is alert.  Psychiatric:        Attention and Perception: He does not perceive auditory hallucinations.        Mood and Affect: Affect is flat.        Speech: Speech normal.        Behavior: Behavior is slowed.        Thought Content: Thought content includes suicidal ideation. Thought content does not include homicidal ideation. Thought content does not include homicidal or suicidal plan.      ED Treatments / Results  Labs (all labs ordered are listed, but only abnormal results are displayed) Labs Reviewed  COMPREHENSIVE METABOLIC PANEL - Abnormal; Notable for the following components:      Result Value    Glucose, Bld 103 (*)    All other components within normal limits  ACETAMINOPHEN LEVEL - Abnormal; Notable for the following components:   Acetaminophen (Tylenol), Serum <10 (*)    All other components within normal limits  RAPID URINE DRUG SCREEN, HOSP PERFORMED - Abnormal; Notable for the following components:   Tetrahydrocannabinol POSITIVE (*)    All other components within normal limits  SARS CORONAVIRUS 2 BY RT PCR (HOSPITAL ORDER, PERFORMED IN [facility])  ETHANOL  SALICYLATE LEVEL  CBC  RAPID HIV SCREEN (HIV 1/2 AB+AG)  RPR    EKG None  Radiology No results found.  Procedures Procedures (including critical care time)  Medications Ordered in  ED Medications  LORazepam (ATIVAN) injection 0-4 mg ( Intravenous See Alternative 04/07/19 0845)    Or  LORazepam (ATIVAN) tablet 0-4 mg (0 mg Oral Not Given 04/07/19 0845)  LORazepam (ATIVAN) injection 0-4 mg (has no administration in time range)    Or  LORazepam (ATIVAN) tablet 0-4 mg (has no administration in time range)  thiamine (VITAMIN B-1) tablet 100 mg (has no administration in time range)    Or  thiamine (B-1) injection 100 mg (has no administration in time range)  ibuprofen (ADVIL) tablet 600 mg (has no administration in time range)  zolpidem (AMBIEN) tablet 5 mg (5 mg Oral Given 04/07/19 0351)  nicotine (NICODERM CQ - dosed in mg/24 hours) patch 21 mg (has no administration in time range)  alum & mag hydroxide-simeth (MAALOX/MYLANTA) 200-200-20 MG/5ML suspension 30 mL (has no administration in time range)     Initial Impression / Assessment and Plan / ED Course  I have reviewed the triage vital signs and the nursing notes.  Pertinent labs & imaging results that were available during my care of the patient were reviewed by me and considered in my medical decision making (see chart for details).        23 year old male presenting to the ER with erratic behavior and suicidal thoughts.  He recently  completed [facility] for what the patient stated was obsessive-compulsive disorder, but his mother reports was for depression and suicidal ideation.  He was initially tachycardic on arrival to the ER, which is since resolved.  He is remained hemodynamically stable in no acute distress.  He remains voluntary at this time.  He is endorsing suicidal ideation at this time.  No history of previous suicide attempts.  Although the timeline of when the symptoms began is conflicting, it sounds like prior to the last month or so that he has not struggled with suicidal ideation.  UDS is positive for THC.  Ethanol level is not elevated, but the patient does endorse frequent, heavy alcohol use.  CIWA order set has been placed.  Labs are otherwise unremarkable.  TTS has been consulted who recommends inpatient treatment.  COVID-19 has been ordered, which is negative.  After reviewing the patient's chart, it appears that the majority of his outpatient visits have been for STI screenings.  Per chart review, the patient has sex exclusively with male partners.  It does not appear that he has had HIV or syphilis testing in more than 2 years in the setting of acute erratic behavior changes and suicidal ideation that has developed over the last month.  RPR testing is pending.  HIV testing is negative.  Pt medically cleared at this time. Psych hold orders and home med orders placed. Please see psych team notes for further documentation of care/dispo. Pt stable at time of med clearance.     Final Clinical Impressions(s) / ED Diagnoses   Final diagnoses:  Suicidal ideation  Behavioral change    ED Discharge Orders    None       Wesley Boards, PA-C 04/07/19 0858    Nira Conn, MD 04/08/19 [phone]

## 2019-04-07 NOTE — ED Notes (Signed)
Lab called to report HIV results which were non reactive.

## 2019-04-07 NOTE — BH Assessment (Signed)
[facility] [place] [facility] Assessment Progress Note  Per Talbot Grumbling, NP, this pt requires psychiatric hospitalization at this time.  Pt has been assigned pt to [facility] Rm 506-2.  Pt has signed Voluntary Admission and Consent for Treatment, as well as Consent to Release Information to pt's mother and to pt's outpatient provider with [facility], and a notification call has been placed to the latter.  Signed forms have been faxed to [facility].  Pt's nurse, Diane, has been notified, and agrees to send original paperwork along with pt via [employer], and to call report to [phone].  Jalene Mullet, [place] Coordinator [phone]

## 2019-04-07 NOTE — ED Notes (Signed)
COVID test completed, waiting on result. Phlebotomy here to draw HIV test which was just ordered.  Cooperative with both tests.

## 2019-04-07 NOTE — ED Notes (Signed)
Admitted to room 41 as a vol patient. He was bizarre per triage RN report while with her. He is soft spoken, poor eye contact but cooperative. Reports being depressed for several months. He reports thoughts without plan or intention to kill self. He was recently in a [facility] and states they gave him Remeron but he lost it. He was offered and given food and drink on arrival. He has nice manners and so far has been appropriate. Reports difficulty sleeping. Will check with MD for orders.

## 2019-04-07 NOTE — ED Notes (Signed)
Report given to [facility] RN at [facility]. [employer] called.

## 2019-04-07 NOTE — H&P (Signed)
Psychiatric Admission Assessment Adult  Patient Identification: Wesley Morales  MRN:  [id number]  Date of Evaluation:  04/07/2019  Chief Complaint: Wandering outside without clothes on, confused, suicidal with plans to cut himself.  Principal Diagnosis: Schizophrenia ([place])  Diagnosis:  Principal Problem:   Schizophrenia ([place])  History of Present Illness: This is the first psychiatric admission in this [facility] for this 23 year old male with prior hx of mental illness & at least one other previous psychiatric hospitalization at the [facility] in [place], [place] for suicide attempt & mood instability. Wesley Morales is being admitted to the [facility] from the [facility] with complaints of wandering outside his home without clothes, confused & threatening to kill himself by cutting. Chart review indicated that he was recently discharged from the [facility] psychiatric hospital about 3 weeks ago. He was brought to the [facility] for evaluation & treatment.  During this assessment, patient reports, "I have been to the [facility] about 3 weeks ago because I attempted to jump off of my window to kill myself. Then, I decided to tell my mother & grand-mother & they took me to the hospital. I was given Mirtazapine, but, I was not really taking it like I'm suppose to. I missed doses here & there. My mother brought me to the [facility] this time because I was walking outside my home with just my pants on, no shirt or shoes. I wanted to get away from everything & get some rest. I was having bad racing thoughts that started 2 weeks ago after I stopped taking the Mirtazapine. This is my second psychiatric hospital in less than a month. I have been feeling very depressed for over a year. 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 I have been hearing voices in the last 2 weeks. I was told it is because I smoke weed. I don't think it was the weed that was making me hear voices, I smoke  e-cigarettes too & use MDMA occasionally. I'm no longer feeling suicidal".  Associated Signs/Symptoms:  Depression Symptoms:  depressed mood, insomnia, suicidal thoughts with specific plan, anxiety,  (Hypo) Manic Symptoms:  Impulsivity, Labiality of Mood,  Anxiety Symptoms:  Excessive Worry,  Psychotic Symptoms:  Hallucinations: Auditory  PTSD Symptoms: NA  Total Time spent with patient: 1 hour  Past Psychiatric History: Denies any familial hx of mental illness.  Is the patient at risk to self? No.  Has the patient been a risk to self in the past 6 months? Yes.    Has the patient been a risk to self within the distant past? Yes.    Is the patient a risk to others? No.  Has the patient been a risk to others in the past 6 months? No.  Has the patient been a risk to others within the distant past? No.   Prior Inpatient Therapy: Yes, [facility] in [place], [place] Prior Outpatient Therapy: Yes  Alcohol Screening: Yes, I drink occasional alcohol.  Substance Abuse History in the last 12 months:  Yes.    Consequences of Substance Abuse: Medical Consequences:  Liver damage, Possible death by overdose Legal Consequences:  Arrests, jail time, Loss of driving privilege. Family Consequences:  Family discord, divorce and or separation.  Previous Psychotropic Medications: (Remeron)  Psychological Evaluations: No   Past Medical History:  Past Medical History:  Diagnosis Date  . SVT (supraventricular tachycardia) (HCC)    No past surgical history on file.  Family History: No  family history on file.  Family Psychiatric  History: None reported  Tobacco Screening: "I smoke cigarettes & weed"  Social History: Single, no children. Unemployed. Recently moved to [place], [place] area from [place]. Social History   Substance and Sexual Activity  Alcohol Use No  . Alcohol/week: 0.0 standard drinks     Social History   Substance and Sexual Activity  Drug Use No     Additional Social History:  Allergies:  No Known Allergies Lab Results:  Results for orders placed or performed during the hospital encounter of 04/07/19 (from the past 48 hour(s))  Comprehensive metabolic panel     Status: Abnormal   Collection Time: 04/07/19 12:47 AM  Result Value Ref Range   Sodium 137 135 - 145 mmol/L   Potassium 3.8 3.5 - 5.1 mmol/L   Chloride 103 98 - 111 mmol/L   CO2 23 22 - 32 mmol/L   Glucose, Bld 103 (H) 70 - 99 mg/dL   BUN 8 6 - 20 mg/dL   Creatinine, Ser 3.47 0.61 - 1.24 mg/dL   Calcium 9.4 8.9 - 42.5 mg/dL   Total Protein 7.6 6.5 - 8.1 g/dL   Albumin 4.9 3.5 - 5.0 g/dL   AST 28 15 - 41 U/L   ALT 28 0 - 44 U/L   Alkaline Phosphatase 56 38 - 126 U/L   Total Bilirubin 0.9 0.3 - 1.2 mg/dL   GFR calc non Af Amer >60 >60 mL/min   GFR calc Af Amer >60 >60 mL/min   Anion gap 11 5 - 15    Comment: Performed at [facility], [address] [place], [place] [address]  Ethanol     Status: None   Collection Time: 04/07/19 12:47 AM  Result Value Ref Range   Alcohol, Ethyl (B) <10 <10 mg/dL    Comment: (NOTE) Lowest detectable limit for serum alcohol is 10 mg/dL. For medical purposes only. Performed at [facility], [address], [place], [place] [address]   Salicylate level     Status: None   Collection Time: 04/07/19 12:47 AM  Result Value Ref Range   Salicylate Lvl <7.0 2.8 - 30.0 mg/dL    Comment: Performed at [facility], [address] [place], [place] [address]  Acetaminophen level     Status: Abnormal   Collection Time: 04/07/19 12:47 AM  Result Value Ref Range   Acetaminophen (Tylenol), Serum <10 (L) 10 - 30 ug/mL    Comment: (NOTE) Therapeutic concentrations vary significantly. A range of 10-30 ug/mL  may be an effective concentration for many patients. However, some  are best treated at concentrations outside of this range. Acetaminophen concentrations >150 ug/mL at 4 hours after  ingestion  and >50 ug/mL at 12 hours after ingestion are often associated with  toxic reactions. Performed at [facility], [address], [place], [place] [address]   cbc     Status: None   Collection Time: 04/07/19 12:47 AM  Result Value Ref Range   WBC 5.4 4.0 - 10.5 K/uL   RBC 5.03 4.22 - 5.81 MIL/uL   Hemoglobin 15.4 13.0 - 17.0 g/dL   HCT 60.6 30.1 - 60.1 %   MCV 90.1 80.0 - 100.0 fL   MCH 30.6 26.0 - 34.0 pg   MCHC 34.0 30.0 - 36.0 g/dL   RDW 09.3 23.5 - 57.3 %   Platelets 361 150 - 400 K/uL   nRBC 0.0 0.0 - 0.2 %  Comment: Performed at [facility], [address], [place], [place] [address]  Rapid urine drug screen (hospital performed)     Status: Abnormal   Collection Time: 04/07/19 12:47 AM  Result Value Ref Range   Opiates NONE DETECTED NONE DETECTED   Cocaine NONE DETECTED NONE DETECTED   Benzodiazepines NONE DETECTED NONE DETECTED   Amphetamines NONE DETECTED NONE DETECTED   Tetrahydrocannabinol POSITIVE (A) NONE DETECTED   Barbiturates NONE DETECTED NONE DETECTED    Comment: (NOTE) DRUG SCREEN FOR MEDICAL PURPOSES ONLY.  IF CONFIRMATION IS NEEDED FOR ANY PURPOSE, NOTIFY LAB WITHIN 5 DAYS. LOWEST DETECTABLE LIMITS FOR URINE DRUG SCREEN Drug Class                     Cutoff (ng/mL) Amphetamine and metabolites    1000 Barbiturate and metabolites    200 Benzodiazepine                 200 Tricyclics and metabolites     300 Opiates and metabolites        300 Cocaine and metabolites        300 THC                            50 Performed at [facility], [address], [place], [place] [address]   SARS Coronavirus 2 by RT PCR (hospital order, performed in [facility] hospital lab) Nasopharyngeal Nasopharyngeal Swab     Status: None   Collection Time: 04/07/19  5:13 AM   Specimen: Nasopharyngeal Swab  Result Value Ref Range   SARS Coronavirus 2 NEGATIVE NEGATIVE    Comment: (NOTE) If result is  NEGATIVE SARS-CoV-2 target nucleic acids are NOT DETECTED. The SARS-CoV-2 RNA is generally detectable in upper and lower  respiratory specimens during the acute phase of infection. The lowest  concentration of SARS-CoV-2 viral copies this assay can detect is 250  copies / mL. A negative result does not preclude SARS-CoV-2 infection  and should not be used as the sole basis for treatment or other  patient management decisions.  A negative result may occur with  improper specimen collection / handling, submission of specimen other  than nasopharyngeal swab, presence of viral mutation(s) within the  areas targeted by this assay, and inadequate number of viral copies  (<250 copies / mL). A negative result must be combined with clinical  observations, patient history, and epidemiological information. If result is POSITIVE SARS-CoV-2 target nucleic acids are DETECTED. The SARS-CoV-2 RNA is generally detectable in upper and lower  respiratory specimens dur ing the acute phase of infection.  Positive  results are indicative of active infection with SARS-CoV-2.  Clinical  correlation with patient history and other diagnostic information is  necessary to determine patient infection status.  Positive results do  not rule out bacterial infection or co-infection with other viruses. If result is PRESUMPTIVE POSTIVE SARS-CoV-2 nucleic acids MAY BE PRESENT.   A presumptive positive result was obtained on the submitted specimen  and confirmed on repeat testing.  While 2019 novel coronavirus  (SARS-CoV-2) nucleic acids may be present in the submitted sample  additional confirmatory testing may be necessary for epidemiological  and / or clinical management purposes  to differentiate between  SARS-CoV-2 and other Sarbecovirus currently known to infect humans.  If clinically indicated additional testing with an alternate test  methodology [phone]) is advised. The SARS-CoV-2 RNA is generally  detectable  in upper and lower respiratory sp ecimens during the acute  phase of infection. The expected result is Negative. Fact Sheet for Patients:  BoilerBrush.com.cyhttps://www.fda.gov/media/136312/download Fact Sheet for Healthcare Providers: https://pope.com/https://www.fda.gov/media/136313/download This test is not yet approved or cleared by the [place] FDA and has been authorized for detection and/or diagnosis of SARS-CoV-2 by FDA under an Emergency Use Authorization (EUA).  This EUA will remain in effect (meaning this test can be used) for the duration of the COVID-19 declaration under Section 564(b)(1) of the Act, 21 U.S.C. section 360bbb-3(b)(1), unless the authorization is terminated or revoked sooner. Performed at [facility], [address] [place], [place] [address]   RPR     Status: None   Collection Time: 04/07/19  5:55 AM  Result Value Ref Range   RPR Ser Ql NON REACTIVE NON REACTIVE    Comment: Performed at [facility], [address] [place], [place] [address]  Rapid HIV screen (HIV 1/2 Ab+Ag)     Status: None   Collection Time: 04/07/19  5:55 AM  Result Value Ref Range   HIV-1 P24 Antigen - HIV24 NON REACTIVE NON REACTIVE    Comment: (NOTE) Detection of p24 may be inhibited by biotin in the sample, causing false negative results in acute infection.    HIV 1/2 Antibodies NON REACTIVE NON REACTIVE   Interpretation (HIV Ag Ab)      A non reactive test result means that HIV 1 or HIV 2 antibodies and HIV 1 p24 antigen were not detected in the specimen.    Comment: RESULT CALLED TO, READ BACK BY AND VERIFIED WITH: BECK,S. RN AT [phone] 04/07/19 MULLINS,T Performed at [facility], [address] [place], [place] [address]    Blood Alcohol level:  Lab Results  Component Value Date   ETH <10 04/07/2019   Metabolic Disorder Labs:  No results found for: HGBA1C, MPG No results found for: PROLACTIN No results found for: CHOL, TRIG, HDL, CHOLHDL, VLDL,  LDLCALC  Current Medications: No current facility-administered medications for this encounter.    PTA Medications: Medications Prior to Admission  Medication Sig Dispense Refill Last Dose  . mirtazapine (REMERON) 15 MG tablet Take 15 mg by mouth at bedtime.      Musculoskeletal: Strength & Muscle Tone: within normal limits Gait & Station: normal Patient leans: N/A  Psychiatric Specialty Exam: Physical Exam  Nursing note and vitals reviewed. Constitutional: He is oriented to person, place, and time. He appears well-developed.  Eyes: Pupils are equal, round, and reactive to light.  Neck: Normal range of motion.  Cardiovascular: Normal rate and normal heart sounds.  Respiratory: Effort normal.  Genitourinary:    Genitourinary Comments: Deferred   Musculoskeletal: Normal range of motion.  Neurological: He is alert and oriented to person, place, and time.  Skin: Skin is warm and dry.    Review of Systems  Constitutional: Negative for chills and fever.  Respiratory: Negative for cough, shortness of breath and wheezing.   Cardiovascular: Negative for chest pain and palpitations.  Gastrointestinal: Negative for abdominal pain, heartburn, nausea and vomiting.  Musculoskeletal: Negative for myalgias.  Skin: Negative.   Neurological: Negative for dizziness and headaches.  Psychiatric/Behavioral: Positive for depression, hallucinations and substance abuse (UDS (+) for THC). Negative for memory loss and suicidal ideas. The patient has insomnia. The patient is not nervous/anxious.     There were no vitals taken for this visit.There is no height or weight on file to calculate  BMI.  General Appearance: Casual and Fairly Groomed  Eye Contact:  Good  Speech:  Clear and Coherent and Normal Rate  Volume:  Normal  Mood:  Depressed  Affect:  Congruent  Thought Process:  Coherent and Descriptions of Associations: Intact  Orientation:  Full (Time, Place, and Person)  Thought Content:   Rumination  Suicidal Thoughts:  Currently denies any thoughts, plans or intent. Hx. suicidal threats & attempt.  Homicidal Thoughts:  Denies  Memory:  Immediate;   Good Recent;   Good Remote;   Good  Judgement:  Fair  Insight:  Fair  Psychomotor Activity:  Normal  Concentration:  Concentration: Good and Attention Span: Good  Recall:  Good  Fund of Knowledge:  Fair  Language:  Good  Akathisia:  Negative  Handed:  Right  AIMS (if indicated):     Assets:  Communication Skills Desire for Improvement Physical Health Social Support  ADL's:  Intact  Cognition:  WNL  Sleep: New admit   Treatment Plan Summary: Daily contact with patient to assess and evaluate symptoms and progress in treatment and Medication management.  Treatment Plan/Recommendations: 1. Admit for crisis management and stabilization, estimated length of stay 3-5 days.   2. Medication management to reduce current symptoms to base line and improve the patient's overall level of functioning: See MAR, Md's SRA & treatment plan.   Observation Level/Precautions:  15 minute checks  Laboratory:  Per ED, UDS (+) for [facility]  Psychotherapy: Group sessions   Medications: See [facility]    Consultations: As needed.   Discharge Concerns: Safety, mood stability.   Estimated LOS: 5-7 days  Other: Admit to the [address].    Physician Treatment Plan for Primary Diagnosis: Schizophrenia (HCC)  Long Term Goal(s): Improvement in symptoms so as ready for discharge  Short Term Goals: Ability to identify changes in lifestyle to reduce recurrence of condition will improve, Ability to verbalize feelings will improve and Ability to demonstrate self-control will improve  Physician Treatment Plan for Secondary Diagnosis: Principal Problem:   Schizophrenia (HCC)  Long Term Goal(s): Improvement in symptoms so as ready for discharge  Short Term Goals: Ability to identify and develop effective coping behaviors will improve, Compliance with prescribed  medications will improve and Ability to identify triggers associated with substance abuse/mental health issues will improve  I certify that inpatient services furnished can reasonably be expected to improve the patient's condition.    Armandina Stammer, NP, PMHNP, FNP-BC 10/28/20201:38 PM

## 2019-04-07 NOTE — ED Triage Notes (Signed)
Patient here from home via [place]. Mom reports patient was wandering out in street with no clothes, confused. Voluntary. Mom reports that patient was just released from a behavioral facility last week. Patient ran into glass door out in lobby and repeatedly hit head on glass.

## 2019-04-07 NOTE — ED Notes (Signed)
Mia PA saw him earlier this am and put orders in including Ambien for sleep. He is asking for something as he did when he was first admitted to room 41. He just completed his TTS assessment and Probation officer gave him his sleeping aide. He has remained very pleasant and appropriate. When asked what he wants to see happen from here states he just wants to get better, when writer asked him what that would look like states get his focus right.

## 2019-04-07 NOTE — ED Notes (Signed)
Per [facility], patient accepted to [facility] Adult Unit pending COVID screening and medical clearance. Room 506 Bed 2 Attending is Dr. Vinson Moselle, RN, informed of acceptance Arrival time after resulted COVID test.

## 2019-04-07 NOTE — Tx Team (Signed)
Initial Treatment Plan 04/07/2019 7:27 PM Wesley Morales [id number]    PATIENT STRESSORS: Substance abuse   PATIENT STRENGTHS: Ability for insight   PATIENT IDENTIFIED PROBLEMS: SA  psychosis                   DISCHARGE CRITERIA:  Improved stabilization in mood, thinking, and/or behavior  PRELIMINARY DISCHARGE PLAN: Return to previous living arrangement  PATIENT/FAMILY INVOLVEMENT: This treatment plan has been presented to and reviewed with the patient, Wesley Morales, and/or family member,The patient and family have been given the opportunity to ask questions and make suggestions.  Clarita Crane, RN 04/07/2019, 7:27 PM

## 2019-04-07 NOTE — ED Notes (Signed)
Currently speaking with TTS via tele med.

## 2019-04-07 NOTE — [facility] Suicide Risk Assessment (Signed)
[facility] Admission Suicide Risk Assessment   Nursing information obtained from:  Patient Demographic factors:  Male Current Mental Status:  NA Loss Factors:  NA Historical Factors:  Prior suicide attempts Risk Reduction Factors:  NA  Total Time spent with patient: 45 minutes Principal Problem: Schizophrenia ([place]) Diagnosis:  Principal Problem:   Schizophrenia ([place]) Active Problems:   MDD (major depressive disorder)  Subjective Data: admitted for presumed psychosis  Continued Clinical Symptoms:  Alcohol Use Disorder Identification Test Final Score (AUDIT): 0 The "Alcohol Use Disorders Identification Test", Guidelines for Use in Primary Care, Second Edition.  World Pharmacologist [facility]). Score between 0-7:  no or low risk or alcohol related problems. Score between 8-15:  moderate risk of alcohol related problems. Score between 16-19:  high risk of alcohol related problems. Score 20 or above:  warrants further diagnostic evaluation for alcohol dependence and treatment.   CLINICAL FACTORS:   Dysthymia  Musculoskeletal: Strength & Muscle Tone: within normal limits Gait & Station: normal Patient leans: N/A  Psychiatric Specialty Exam: Physical Exam  Nursing note and vitals reviewed. Constitutional: He is oriented to person, place, and time. He appears well-developed.  Eyes: Pupils are equal, round, and reactive to light.  Neck: Normal range of motion.  Cardiovascular: Normal rate and normal heart sounds.  Respiratory: Effort normal.  Genitourinary:    Genitourinary Comments: Deferred   Musculoskeletal: Normal range of motion.  Neurological: He is alert and oriented to person, place, and time.  Skin: Skin is warm and dry.    Review of Systems  Constitutional: Negative for chills and fever.  Respiratory: Negative for cough, shortness of breath and wheezing.   Cardiovascular: Negative for chest pain and palpitations.  Gastrointestinal: Negative for abdominal pain,  heartburn, nausea and vomiting.  Musculoskeletal: Negative for myalgias.  Skin: Negative.   Neurological: Negative for dizziness and headaches.  Psychiatric/Behavioral: Positive for depression, hallucinations and substance abuse (UDS (+) for THC). Negative for memory loss and suicidal ideas. The patient has insomnia. The patient is not nervous/anxious.     There were no vitals taken for this visit.There is no height or weight on file to calculate BMI.  General Appearance: Casual and Fairly Groomed  Eye Contact:  Good  Speech:  Clear and Coherent and Normal Rate  Volume:  Normal  Mood:  Depressed  Affect:  Congruent  Thought Process:  Coherent and Descriptions of Associations: Intact  Orientation:  Full (Time, Place, and Person)  Thought Content:  Rumination  Suicidal Thoughts:  Currently denies any thoughts, plans or intent. Hx. suicidal threats & attempt.  Homicidal Thoughts:  Denies  Memory:  Immediate;   Good Recent;   Good Remote;   Good  Judgement:  Fair  Insight:  Fair  Psychomotor Activity:  Normal  Concentration:  Concentration: Good and Attention Span: Good  Recall:  Good  Fund of Knowledge:  Fair  Language:  Good  Akathisia:  Negative  Handed:  Right  AIMS (if indicated):     Assets:  Communication Skills Desire for Improvement Physical Health Social Support  ADL's:  Intact  Cognition:  WNL  Sleep: New admit      COGNITIVE FEATURES THAT CONTRIBUTE TO RISK:  Loss of executive function    SUICIDE RISK:   Minimal: No identifiable suicidal ideation.  Patients presenting with no risk factors but with morbid ruminations; may be classified as minimal risk based on the severity of the depressive symptoms  PLAN OF CARE: see eval  I certify that  inpatient services furnished can reasonably be expected to improve the patient's condition.   Malvin Johns, MD 04/07/2019, 3:59 PM

## 2019-04-08 DIAGNOSIS — F200 Paranoid schizophrenia: Secondary | ICD-10-CM | POA: Diagnosis not present

## 2019-04-08 MED ORDER — RISPERIDONE 3 MG PO TABS
3.0000 mg | ORAL_TABLET | Freq: Two times a day (BID) | ORAL | Status: DC
  Administered 2019-04-08 – 2019-04-09 (×2): 3 mg via ORAL
  Filled 2019-04-08 (×6): qty 1

## 2019-04-08 NOTE — BHH Suicide Risk Assessment (Cosign Needed)
B[facility] INPATIENT:  Family/Significant Other Suicide Prevention Education  Suicide Prevention Education:  Education Completed; with mother, Wesley Morales, [phone] has been identified by the patient as the family member/significant other with whom the patient will be residing, and identified as the person(s) who will aid the patient in the event of a mental health crisis (suicidal ideations/suicide attempt).  With written consent from the patient, the family member/significant other has been provided the following suicide prevention education, prior to the and/or following the discharge of the patient.  The suicide prevention education provided includes the following:  Suicide risk factors  Suicide prevention and interventions  National Suicide Hotline telephone number  [facility] assessment telephone number  [facility] Emergency Assistance [place] and/or Residential Mobile Crisis Unit telephone number  Request made of family/significant other to:  Remove weapons (e.g., guns, rifles, knives), all items previously/currently identified as safety concern.    Remove drugs/medications (over-the-counter, prescriptions, illicit drugs), all items previously/currently identified as a safety concern.  The family member/significant other verbalizes understanding of the suicide prevention education information provided.  The family member/significant other agrees to remove the items of safety concern listed above.   Pt's mother had questions about pt's diagnoses and medication. Pt's mother also asked about MyChart and getting medical records. Pt's mother also asked for Dr. Rella Larve number to ask more about diagnoses. Pt's mother did state there is a gun in the home but it is safely secure.   Wesley Morales 04/08/2019, 3:21 PM

## 2019-04-08 NOTE — Progress Notes (Signed)
   04/08/19 2300  Psych Admission Type (Psych Patients Only)  Admission Status Voluntary  Psychosocial Assessment  Patient Complaints None  Eye Contact None  Facial Expression Flat  Affect Depressed;Appropriate to circumstance  Speech Soft;Slow  Interaction Minimal  Motor Activity Slow  Appearance/Hygiene Disheveled  Behavior Characteristics Cooperative;Anxious  Mood Depressed  Thought Process  Coherency WDL  Content WDL  Delusions None reported or observed  Perception WDL  Hallucination None reported or observed  Judgment Impaired  Confusion None  Danger to Self  Current suicidal ideation? Denies  Danger to Others  Danger to Others None reported or observed   D: Pt kept to himself much of the evening. Pt stated he was feeling a little better. A: Pt was offered support and encouragement. Pt was given scheduled medications. Pt was encourage to attend groups. Q 15 minute checks were done for safety.  R: safety maintained on unit.
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
## 2019-04-08 NOTE — Progress Notes (Signed)
Patient reports a reduction in voices this shift.  Patient has been compliant with medications this shift.  Patient has had no incidents of behavioral dyscontrol.   Assess patient for safety offer medications as prescribed.   Continue to monitor as planned.

## 2019-04-08 NOTE — Progress Notes (Signed)
[facility] MD Progress Note  04/08/2019 10:58 AM Wesley Morales  MRN:  [id number] Subjective:   Poke with the patient's mother she informs that it probably was cannabis induced paranoia and psychosis patient is apologetic he is recalibrated almost to baseline he is alert and oriented and cooperative.  He explains that he has no hallucinations or paranoia.  Despite recent Cannabis-induced paranoia we want to make sure does not convert to a permanent condition which explained to the patient the role of neuro protection.  Further he elaborates that his suicidal thoughts were just because his "thoughts are racing" and he felt out of sorts and so many words.  Principal Problem: Schizophrenia (HCC) Diagnosis: Principal Problem:   Schizophrenia (HCC) Active Problems:   MDD (major depressive disorder)  Total Time spent with patient: 15 minutes  Past Psychiatric History: w/ aunt x 6 months in [place] - and was not thought to be using until Tues  Past Medical History:  Past Medical History:  Diagnosis Date  . SVT (supraventricular tachycardia) (HCC)    History reviewed. No pertinent surgical history. Family History: History reviewed. No pertinent family history. Family Psychiatric  History: negative acc to mother/Great grandfather undefined mental illness Social History:  Social History   Substance and Sexual Activity  Alcohol Use No  . Alcohol/week: 0.0 standard drinks     Social History   Substance and Sexual Activity  Drug Use No    Social History   Socioeconomic History  . Marital status: Single    Spouse name: Not on file  . Number of children: Not on file  . Years of education: Not on file  . Highest education level: Not on file  Occupational History  . Not on file  Social Needs  . Financial resource strain: Not on file  . Food insecurity    Worry: Not on file    Inability: Not on file  . Transportation needs    Medical: Not on file    Non-medical: Not on file  Tobacco Use  .  Smoking status: Current Every Day Smoker  . Smokeless tobacco: Never Used  Substance and Sexual Activity  . Alcohol use: No    Alcohol/week: 0.0 standard drinks  . Drug use: No  . Sexual activity: Not on file  Lifestyle  . Physical activity    Days per week: Not on file    Minutes per session: Not on file  . Stress: Not on file  Relationships  . Social Musician on phone: Not on file    Gets together: Not on file    Attends religious service: Not on file    Active member of club or organization: Not on file    Attends meetings of clubs or organizations: Not on file    Relationship status: Not on file  Other Topics Concern  . Not on file  Social History Narrative  . Not on file   Additional Social History:                         Sleep: Good  Appetite:  Good  Current Medications: Current Facility-Administered Medications  Medication Dose Route Frequency Provider Last Rate Last Dose  . acetaminophen (TYLENOL) tablet 650 mg  650 mg Oral Q6H PRN Anike, Adaku C, NP      . alum & mag hydroxide-simeth (MAALOX/MYLANTA) 200-200-20 MG/5ML suspension 30 mL  30 mL Oral Q4H PRN Anike, Adaku C, NP      .  benztropine (COGENTIN) tablet 0.5 mg  0.5 mg Oral BID Malvin Johns, MD   0.5 mg at 04/08/19 0825  . hydrOXYzine (ATARAX/VISTARIL) tablet 25 mg  25 mg Oral TID PRN Anike, Adaku C, NP      . influenza vac split quadrivalent PF (FLUARIX) injection 0.5 mL  0.5 mL Intramuscular Tomorrow-1000 Malvin Johns, MD      . magnesium hydroxide (MILK OF MAGNESIA) suspension 30 mL  30 mL Oral Daily PRN Anike, Adaku C, NP      . omega-3 acid ethyl esters (LOVAZA) capsule 1 g  1 g Oral BID Malvin Johns, MD   1 g at 04/08/19 0825  . risperiDONE (RISPERDAL) tablet 2 mg  2 mg Oral BID Malvin Johns, MD   2 mg at 04/08/19 0825  . traZODone (DESYREL) tablet 150 mg  150 mg Oral QHS Malvin Johns, MD        Lab Results:  Results for orders placed or performed during the hospital encounter  of 04/07/19 (from the past 48 hour(s))  Comprehensive metabolic panel     Status: Abnormal   Collection Time: 04/07/19 12:47 AM  Result Value Ref Range   Sodium 137 135 - 145 mmol/L   Potassium 3.8 3.5 - 5.1 mmol/L   Chloride 103 98 - 111 mmol/L   CO2 23 22 - 32 mmol/L   Glucose, Bld 103 (H) 70 - 99 mg/dL   BUN 8 6 - 20 mg/dL   Creatinine, Ser 1.61 0.61 - 1.24 mg/dL   Calcium 9.4 8.9 - 09.6 mg/dL   Total Protein 7.6 6.5 - 8.1 g/dL   Albumin 4.9 3.5 - 5.0 g/dL   AST 28 15 - 41 U/L   ALT 28 0 - 44 U/L   Alkaline Phosphatase 56 38 - 126 U/L   Total Bilirubin 0.9 0.3 - 1.2 mg/dL   GFR calc non Af Amer >60 >60 mL/min   GFR calc Af Amer >60 >60 mL/min   Anion gap 11 5 - 15    Comment: Performed at [facility], [address], [place], [place] [address]  Ethanol     Status: None   Collection Time: 04/07/19 12:47 AM  Result Value Ref Range   Alcohol, Ethyl (B) <10 <10 mg/dL    Comment: (NOTE) Lowest detectable limit for serum alcohol is 10 mg/dL. For medical purposes only. Performed at [facility], [address] [place], [place] [address]   Salicylate level     Status: None   Collection Time: 04/07/19 12:47 AM  Result Value Ref Range   Salicylate Lvl <7.0 2.8 - 30.0 mg/dL    Comment: Performed at [facility], [address] [place], [place] [address]  Acetaminophen level     Status: Abnormal   Collection Time: 04/07/19 12:47 AM  Result Value Ref Range   Acetaminophen (Tylenol), Serum <10 (L) 10 - 30 ug/mL    Comment: (NOTE) Therapeutic concentrations vary significantly. A range of 10-30 ug/mL  may be an effective concentration for many patients. However, some  are best treated at concentrations outside of this range. Acetaminophen concentrations >150 ug/mL at 4 hours after ingestion  and >50 ug/mL at 12 hours after ingestion are often associated with  toxic reactions. Performed at [facility],  [address], [place], [place] [address]   cbc     Status: None   Collection Time: 04/07/19 12:47 AM  Result Value Ref Range   WBC 5.4  4.0 - 10.5 K/uL   RBC 5.03 4.22 - 5.81 MIL/uL   Hemoglobin 15.4 13.0 - 17.0 g/dL   HCT 82.945.3 56.239.0 - 13.052.0 %   MCV 90.1 80.0 - 100.0 fL   MCH 30.6 26.0 - 34.0 pg   MCHC 34.0 30.0 - 36.0 g/dL   RDW 86.513.6 78.411.5 - 69.615.5 %   Platelets 361 150 - 400 K/uL   nRBC 0.0 0.0 - 0.2 %    Comment: Performed at [facility], [address] [place], [place] [address]  Rapid urine drug screen (hospital performed)     Status: Abnormal   Collection Time: 04/07/19 12:47 AM  Result Value Ref Range   Opiates NONE DETECTED NONE DETECTED   Cocaine NONE DETECTED NONE DETECTED   Benzodiazepines NONE DETECTED NONE DETECTED   Amphetamines NONE DETECTED NONE DETECTED   Tetrahydrocannabinol POSITIVE (A) NONE DETECTED   Barbiturates NONE DETECTED NONE DETECTED    Comment: (NOTE) DRUG SCREEN FOR MEDICAL PURPOSES ONLY.  IF CONFIRMATION IS NEEDED FOR ANY PURPOSE, NOTIFY LAB WITHIN 5 DAYS. LOWEST DETECTABLE LIMITS FOR URINE DRUG SCREEN Drug Class                     Cutoff (ng/mL) Amphetamine and metabolites    1000 Barbiturate and metabolites    200 Benzodiazepine                 200 Tricyclics and metabolites     300 Opiates and metabolites        300 Cocaine and metabolites        300 THC                            50 Performed at [facility], [address] [place], [place] [address]   SARS Coronavirus 2 by RT PCR (hospital order, performed in [facility] hospital lab) Nasopharyngeal Nasopharyngeal Swab     Status: None   Collection Time: 04/07/19  5:13 AM   Specimen: Nasopharyngeal Swab  Result Value Ref Range   SARS Coronavirus 2 NEGATIVE NEGATIVE    Comment: (NOTE) If result is NEGATIVE SARS-CoV-2 target nucleic acids are NOT DETECTED. The SARS-CoV-2 RNA is generally detectable in upper and lower  respiratory specimens  during the acute phase of infection. The lowest  concentration of SARS-CoV-2 viral copies this assay can detect is 250  copies / mL. A negative result does not preclude SARS-CoV-2 infection  and should not be used as the sole basis for treatment or other  patient management decisions.  A negative result may occur with  improper specimen collection / handling, submission of specimen other  than nasopharyngeal swab, presence of viral mutation(s) within the  areas targeted by this assay, and inadequate number of viral copies  (<250 copies / mL). A negative result must be combined with clinical  observations, patient history, and epidemiological information. If result is POSITIVE SARS-CoV-2 target nucleic acids are DETECTED. The SARS-CoV-2 RNA is generally detectable in upper and lower  respiratory specimens dur ing the acute phase of infection.  Positive  results are indicative of active infection with SARS-CoV-2.  Clinical  correlation with patient history and other diagnostic information is  necessary to determine patient infection status.  Positive results do  not rule out bacterial infection or co-infection with other viruses. If result is PRESUMPTIVE POSTIVE SARS-CoV-2 nucleic acids MAY BE PRESENT.   A presumptive positive result  was obtained on the submitted specimen  and confirmed on repeat testing.  While 2019 novel coronavirus  (SARS-CoV-2) nucleic acids may be present in the submitted sample  additional confirmatory testing may be necessary for epidemiological  and / or clinical management purposes  to differentiate between  SARS-CoV-2 and other Sarbecovirus currently known to infect humans.  If clinically indicated additional testing with an alternate test  methodology [phone]) is advised. The SARS-CoV-2 RNA is generally  detectable in upper and lower respiratory sp ecimens during the acute  phase of infection. The expected result is Negative. Fact Sheet for Patients:   StrictlyIdeas.no Fact Sheet for Healthcare Providers: BankingDealers.co.za This test is not yet approved or cleared by the [place] FDA and has been authorized for detection and/or diagnosis of SARS-CoV-2 by FDA under an Emergency Use Authorization (EUA).  This EUA will remain in effect (meaning this test can be used) for the duration of the COVID-19 declaration under Section 564(b)(1) of the Act, 21 U.S.C. section 360bbb-3(b)(1), unless the authorization is terminated or revoked sooner. Performed at [facility], [place] [address] [place], [place] [address]   RPR     Status: None   Collection Time: 04/07/19  5:55 AM  Result Value Ref Range   RPR Ser Ql NON REACTIVE NON REACTIVE    Comment: Performed at [facility], [place] [address], [place], [place] [address]  Rapid HIV screen (HIV 1/2 Ab+Ag)     Status: None   Collection Time: 04/07/19  5:55 AM  Result Value Ref Range   HIV-1 P24 Antigen - HIV24 NON REACTIVE NON REACTIVE    Comment: (NOTE) Detection of p24 may be inhibited by biotin in the sample, causing false negative results in acute infection.    HIV 1/2 Antibodies NON REACTIVE NON REACTIVE   Interpretation (HIV Ag Ab)      A non reactive test result means that HIV 1 or HIV 2 antibodies and HIV 1 p24 antigen were not detected in the specimen.    Comment: RESULT CALLED TO, READ BACK BY AND VERIFIED WITH: BECK,S. RN AT [phone] 04/07/19 MULLINS,T Performed at [facility], [place] [address] [place], [place] [address]     Blood Alcohol level:  Lab Results  Component Value Date   ETH <10 81/82/9937    Metabolic Disorder Labs: No results found for: HGBA1C, MPG No results found for: PROLACTIN No results found for: CHOL, TRIG, HDL, CHOLHDL, VLDL, LDLCALC  Physical Findings: AIMS:  , ,  ,  ,    CIWA:    COWS:     Musculoskeletal: Strength & Muscle Tone: within normal limits Gait &  Station: normal Patient leans: N/A  Psychiatric Specialty Exam: Physical Exam  ROS  Blood pressure 129/73, pulse 85, temperature 98.6 F (37 C), temperature source Oral, resp. rate 18, height 5\' 11"  (1.803 m), weight 65.8 kg, SpO2 99 %.Body mass index is 20.22 kg/m.  General Appearance: Casual  Eye Contact:  Good  Speech:  Clear and Coherent  Volume:  Decreased  Mood:  Dysphoric  Affect:  Congruent  Thought Process:  Linear and Descriptions of Associations: Circumstantial  Orientation:  Full (Time, Place, and Person)  Thought Content:  Delusions  Suicidal Thoughts:  No  Homicidal Thoughts:  No  Memory:  Immediate;   Poor Recent;   Fair Remote;   Fair  Judgement:  Fair  Insight:  Fair  Psychomotor Activity:  Normal  Concentration:  Concentration: Good and Attention  Span: Good  Recall:  [employer] of Knowledge:  Fair  Language:  Fair  Akathisia:  Negative  Handed:  Right  AIMS (if indicated):     Assets:  Communication Skills Leisure Time Physical Health Resilience  ADL's:  Intact  Cognition:  WNL  Sleep:  Number of Hours: 9.5   Treatment Plan Summary: Daily contact with patient to assess and evaluate symptoms and progress in treatment and Medication management   Continue low-dose Risperdal continue neuro protection probable discharge tomorrow  Malvin Johns, MD 04/08/2019, 10:58 AM

## 2019-04-08 NOTE — Progress Notes (Signed)
Recreation Therapy Notes  INPATIENT RECREATION THERAPY ASSESSMENT  Patient Details Name: Wesley Morales MRN: [id number] DOB: [birth year] Today's Date: 04/08/2019       Information Obtained From: Patient  Able to Participate in Assessment/Interview: Yes  Patient Presentation: Alert  Reason for Admission (Per Patient): Other (Comments)(Manic episode)  Patient Stressors: Other (Comment), Work(Self, in his own head a lot, finances)  Coping Skills:   Isolation, Journal, TV, Sports, Arguments, Aggression, Music, Exercise, Meditate, Deep Breathing, Substance Abuse, Impulsivity, Art, Prayer, Avoidance, Read, Hot Bath/Shower  Leisure Interests (2+):  Music - Listen, Individual - Other (Comment)(Cooking; Self-reflect)  Frequency of Recreation/Participation: Other (Comment)(Music, Cook- Daily; Self reflect- When he can)  Awareness of Community Resources:  Yes  [employer]:  [place], [place], Engineer, building services  Current Use: Yes  If no, Barriers?:    Expressed Interest in [place]: No  [employer] of Residence:  [place]  Patient Main Form of Transportation: Musician  Patient Strengths:  Hydrologist; Like to drive  Patient Identified Areas of Improvement:  Communication; Singing  Patient Goal for Hospitalization:  "to be a good person, happy person and strong"  Current SI (including self-harm):  No  Current HI:  No  Current AVH: No  Staff Intervention Plan: Group Attendance, Collaborate with Interdisciplinary Treatment Team  Consent to Intern Participation: N/A    Victorino Sparrow, LRT/CTRS  Ria Comment, Zykeriah Mathia A 04/08/2019, 12:12 PM

## 2019-04-08 NOTE — BHH Group Notes (Signed)
[facility] LCSW Group Therapy Note   Date and Time: 04/08/2019 @ 1:30pm  Type of Group and Topic: Psychoeducational Group: Discharge Planning and Individual Wellness  Participation Level: BHH PARTICIPATION LEVEL: None    Description of Group: Discharge planning group reviews patient's anticipated discharge plans and assists patients to anticipate and address any barriers to wellness/recovery in the community. Suicide prevention education is reviewed with patients in group. Therapeutic Goals 1. Patients will state their anticipated discharge plan and mental health aftercare 2. Patients will identify potential barriers to wellness in the community setting 3. Patients will engage in problem solving, solution focused discussion of ways to anticipate and address barriers to wellness/recovery     Summary of Patient Progress:  Patient was engaged throughout group therapy today but did not participate in group today.      Therapeutic Modalities: Motivational Interviewing and Psycho-Education

## 2019-04-08 NOTE — Progress Notes (Signed)
Recreation Therapy Notes  Date: 10.29.20 Time: 1000 Location: [address] Dayroom  Group Topic: Leisure Education  Goal Area(s) Addresses:  Patient will identify positive leisure activities.  Patient will identify one positive benefit of participation in leisure activities.   Behavioral Response: Engaged  Intervention: Leisure Group Game  Activity: Pictionary.  Patients were split into 2 groups.  One person from the group would pick a word from the container and draw it on the board.  Their team has one minute to guess what the picture is.  If they guess correctly, they get the point.  If they don't guess it, the other team gets a chance to steal the point.  Education:  Leisure Education, Dentist  Education Outcome: Acknowledges education/In group clarification offered/Needs additional education  Clinical Observations/Feedback: Pt was appropriate and fully engaged in group activity.  Pt was pleasant and worked well with his peers.  Pt was bright and attentive.    Victorino Sparrow, LRT/CTRS         Victorino Sparrow A 04/08/2019 11:52 AM

## 2019-04-09 DIAGNOSIS — F200 Paranoid schizophrenia: Secondary | ICD-10-CM | POA: Diagnosis not present

## 2019-04-09 MED ORDER — RISPERIDONE 4 MG PO TABS: 4.0000 mg | ORAL_TABLET | Freq: Two times a day (BID) | ORAL | 1 refills | Status: AC

## 2019-04-09 MED ORDER — BENZTROPINE MESYLATE 0.5 MG PO TABS: 0.5000 mg | ORAL_TABLET | Freq: Two times a day (BID) | ORAL | 3 refills | Status: AC

## 2019-04-09 MED ORDER — ENLYTE PO CAPS: ORAL_CAPSULE | ORAL | 11 refills | Status: AC

## 2019-04-09 MED ORDER — OMEGA-3-ACID ETHYL ESTERS 1 G PO CAPS: 1.0000 g | ORAL_CAPSULE | Freq: Two times a day (BID) | ORAL | 6 refills | Status: AC

## 2019-04-09 NOTE — Discharge Summary (Signed)
Physician Discharge Summary Note  Patient:  Wesley Morales is an 23 y.o., male  MRN:  [id number]  DOB:  05-15-1996  Patient phone:  [phone] (home)   Patient address:   [place] [address],   Total Time spent with patient: Greater than 30 minutes  Date of Admission:  04/07/2019  Date of Discharge: 04-09-19  Reason for Admission: Patient was wandering outside his home without clothes on, confused & threatening to kill himself by cutting.  Principal Problem: Schizophrenia [facility])  Discharge Diagnoses: Principal Problem:   Schizophrenia ([place]) Active Problems:   MDD (major depressive disorder)  Past Psychiatric History: Schizophrenia  Past Medical History:  Past Medical History:  Diagnosis Date  . SVT (supraventricular tachycardia) ([place])    History reviewed. No pertinent surgical history.  Family History: History reviewed. No pertinent family history.  Family Psychiatric  History: See H&P  Social History:  Social History   Substance and Sexual Activity  Alcohol Use No  . Alcohol/week: 0.0 standard drinks     Social History   Substance and Sexual Activity  Drug Use No    Social History   Socioeconomic History  . Marital status: Single    Spouse name: Not on file  . Number of children: Not on file  . Years of education: Not on file  . Highest education level: Not on file  Occupational History  . Not on file  Social Needs  . Financial resource strain: Not on file  . Food insecurity    Worry: Not on file    Inability: Not on file  . Transportation needs    Medical: Not on file    Non-medical: Not on file  Tobacco Use  . Smoking status: Current Every Day Smoker  . Smokeless tobacco: Never Used  Substance and Sexual Activity  . Alcohol use: No    Alcohol/week: 0.0 standard drinks  . Drug use: No  . Sexual activity: Not on file  Lifestyle  . Physical activity    Days per week: Not on file    Minutes per session: Not on file   . Stress: Not on file  Relationships  . Social Herbalist on phone: Not on file    Gets together: Not on file    Attends religious service: Not on file    Active member of club or organization: Not on file    Attends meetings of clubs or organizations: Not on file    Relationship status: Not on file  Other Topics Concern  . Not on file  Social History Narrative  . Not on file   Hospital Course: (Per Md's admission evaluation): This is the first psychiatric admission in this [facility] for this 23 year old male with prior hx of mental illness & at least one other previous psychiatric hospitalization at the [facility] in [place], [place] for suicide attempt & mood instability. Chino is being admitted to the [facility] from the [facility] with complaints of wandering outside his home without clothes, confused & threatening to kill himself by cutting. Chart review indicated that he was recently discharged from the [place] psychiatric hospital about 3 weeks ago. He was brought to the [facility] for evaluation & treatment. During this assessment, patient reports, "I have been to the [facility] about 3 weeks ago because I attempted to jump off of my window to kill myself. Then, I decided to tell my mother & grand-mother & they took me to the  hospital. I was given Mirtazapine, but, I was not really taking it like I'm suppose to. I missed doses here & there. My mother brought me to the [facility] this time because I was walking outside my home with just my pants on, no shirt or shoes. I wanted to get away from everything & get some rest. I was having bad racing thoughts that started 2 weeks ago after I stopped taking the Mirtazapine. This is my second psychiatric hospital in less than a month. I have been feeling very depressed for over a year. 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 I have been hearing voices in the last 2 weeks. I was told it is because I smoke  weed. I don't think it was the weed that was making me hear voices, I smoke e-cigarettes too & use MDMA occasionally.   Fallou was admitted to the hospital with his UDS positive THC. However, his reason for admission was he was observed wandering outside his home without clothes on, confused & threatening to kill himself by cutting. He was in need of mood stabilization treatment.   After evaluation of his presenting symptoms, Calyx was started on the medication regimen for his presenting symptoms. He received, stabilized & discharged on the medications as listed on his discharged medication lists below. He was also enrolled  & participated in the group counseling sessions being offered & held on this unit, he learned coping skills that should help him cope better & maintain mood stability after discharge. He presented no other significant pre-existing health issues that required treatment & or monitoring. He tolerated his treatment regimen without any significant adverse effects and or reactions.  During the course of his hospitalizations, Lyonel's symptoms were evaluated on daily basis by a clinical provider to ascertain his symptoms are responding to his treatment regimen. This is evidenced by his reports of decreasing symptoms, improved mood, sleep & presentation of good affect. He is mentally & medically stable. He is being discharged today as he has met the maximum benefit of his treatment here. He will continue mental health care & medication management on na outpatient basis as noted below. He is provided with all the pertinent information required to make this appointment without problems.   On this day of his hospital discharge, Yiannis is in much improved condition than upon admission. He contracted for his safety and felt more in control of his mood. His symptoms were reported as significantly decreased or resolved completely. He denies any SI/HI, AVH, delusional thoughts or paranoia. He is instructed  & motivated to continue taking medications with a goal of continued improvement in mental health. He was able to engage in safety planning including plan to return to [facility] or contact emergency services if he feels unable to maintain his own safety or the safety of others.. He was picked up by his father. He left [facility] in no apparent distress with all belongings.Transportation per mother.  Physical Findings: AIMS:  , ,  ,  ,    CIWA:    COWS:     Musculoskeletal: Strength & Muscle Tone: within normal limits Gait & Station: normal Patient leans: N/A  Psychiatric Specialty Exam: Physical Exam  Nursing note and vitals reviewed. Constitutional: He is oriented to person, place, and time. He appears well-developed.  Cardiovascular: Normal rate.  Respiratory: Effort normal.  Genitourinary:    Genitourinary Comments: Deferred   Musculoskeletal: Normal range of motion.  Neurological: He is alert  and oriented to person, place, and time.  Skin: Skin is warm and dry.    Review of Systems  Constitutional: Negative for chills and fever.  Respiratory: Negative for cough, shortness of breath and wheezing.   Cardiovascular: Negative for chest pain and palpitations.  Gastrointestinal: Negative for heartburn, nausea and vomiting.  Neurological: Negative for dizziness and headaches.  Psychiatric/Behavioral: Positive for hallucinations (Hx. drug induced psychosis (Stabilized with medication prior to discharge)) and substance abuse (Hx. HTC use disorder). Negative for depression, memory loss and suicidal ideas. The patient has insomnia (Stabilized with medication prior to discharge). The patient is not nervous/anxious (Stable).     Blood pressure (!) 107/53, pulse 99, temperature 98.6 F (37 C), temperature source Oral, resp. rate 18, height 5' 11"  (1.803 m), weight 65.8 kg, SpO2 99 %.Body mass index is 20.22 kg/m.  See Md's discharge SRA   Has this patient used any form of tobacco in the last 30 days?  (Cigarettes, Smokeless Tobacco, Cigars, and/or Pipes):  N/A  Blood Alcohol level:  Lab Results  Component Value Date   ETH <10 61/60/7371   Metabolic Disorder Labs:  No results found for: HGBA1C, MPG No results found for: PROLACTIN No results found for: CHOL, TRIG, HDL, CHOLHDL, VLDL, LDLCALC  See Psychiatric Specialty Exam and Suicide Risk Assessment completed by Attending Physician prior to discharge.  Discharge destination:  Home  Is patient on multiple antipsychotic therapies at discharge:  No   Has Patient had three or more failed trials of antipsychotic monotherapy by history:  No  Recommended Plan for Multiple Antipsychotic Therapies: NA  Allergies as of 04/09/2019   No Known Allergies     Medication List    TAKE these medications     Indication  benztropine 0.5 MG tablet Commonly known as: COGENTIN Take 1 tablet (0.5 mg total) by mouth 2 (two) times daily.  Indication: Extrapyramidal Reaction caused by Medications   EnLyte Caps 1 a day maye get from [email or web address]  Indication: 21-Hydroxylase Deficiency   mirtazapine 15 MG tablet Commonly known as: REMERON Take 15 mg by mouth at bedtime.  Indication: Major Depressive Disorder   omega-3 acid ethyl esters 1 g capsule Commonly known as: LOVAZA Take 1 capsule (1 g total) by mouth 2 (two) times daily.  Indication: High Amount of Triglycerides in the Blood   risperidone 4 MG tablet Commonly known as: RISPERDAL Take 1 tablet (4 mg total) by mouth 2 (two) times daily.  Indication: Schizophrenia      Follow-up Information    BEHAVIORAL HEALTH OUTPATIENT THERAPY [place] Follow up on 04/20/2019.   Specialty: Behavioral Health Why: Therapy with Oneita Kras is Tuesday 11/10 at 3:00p.  Medication management with Dr. Mamie Nick is Friday 11/20 at 9:00a.  Appts will be virtual.  Contact information: [place] [phone] [place] [phone]         Follow-up  recommendations: Activity:  As tolerated Diet: As recommended by your primary care doctor. Keep all scheduled follow-up appointments as recommended.  Comments: Prescriptions given at discharge.  Patient agreeable to plan.  Given opportunity to ask questions.  Appears to feel comfortable with discharge denies any current suicidal or homicidal thought. Patient is also instructed prior to discharge to: Take all medications as prescribed by his/her mental healthcare provider. Report any adverse effects and or reactions from the medicines to his/her outpatient provider promptly. Patient has been instructed & cautioned: To not engage in alcohol and or illegal drug use while on  prescription medicines. In the event of worsening symptoms, patient is instructed to call the crisis hotline, 911 and or go to the nearest ED for appropriate evaluation and treatment of symptoms. To follow-up with his/her primary care provider for your other medical issues, concerns and or health care needs.  Signed: Lindell Spar, NP, PMHNP, FNP-BC 04/09/2019, 10:38 AM

## 2019-04-09 NOTE — [facility] Group Notes (Signed)
LCSW Group Therapy Note    Date/Time: 04/09/2019 @ 11:30am  Mood: Did not attend   Type of Therapy and Topic: Group Therapy: Avoiding Self-Sabotaging and Enabling Behaviors  Participation Level: Did Not Attend   Description of Group:  In this group, patients will learn how to identify obstacles, self-sabotaging and enabling behaviors, as well as: what are they, why do we do them and what needs these behaviors meet. Discuss unhealthy relationships and how to have positive healthy boundaries with those that sabotage and enable. Explore aspects of self-sabotage and enabling in yourself and how to limit these self-destructive behaviors in everyday life.   Therapeutic Goals: 1. Patient will identify one obstacle that relates to self-sabotage and enabling behaviors 2. Patient will identify one personal self-sabotaging or enabling behavior they did prior to admission 3. Patient will state a plan to change the above identified behavior 4. Patient will demonstrate ability to communicate their needs through discussion and/or role play.    Summary of Patient Progress:  Patient did not attend group today.     Therapeutic Modalities:  Cognitive Behavioral Therapy Person-Centered Therapy Motivational Interviewing    [place], [place]

## 2019-04-09 NOTE — [facility] Suicide Risk Assessment (Signed)
[facility] Discharge Suicide Risk Assessment   Principal Problem: Schizophrenia [facility]) Discharge Diagnoses: Principal Problem:   Schizophrenia ([place]) Active Problems:   MDD (major depressive disorder)   Total Time spent with patient: 45 minutes  Musculoskeletal: Strength & Muscle Tone: within normal limits Gait & Station: normal Patient leans: N/A  Psychiatric Specialty Exam: ROS  Blood pressure (!) 107/53, pulse 99, temperature 98.6 F (37 C), temperature source Oral, resp. rate 18, height 5\' 11"  (1.803 m), weight 65.8 kg, SpO2 99 %.Body mass index is 20.22 kg/m.  General Appearance: Casual  Eye Contact::  Good  Speech:  Clear and Coherent409  Volume:  Normal  Mood:  Euthymic  Affect:  Constricted  Thought Process:  Coherent and Descriptions of Associations: Circumstantial  Orientation:  Full (Time, Place, and Person)  Thought Content:  Logical  Suicidal Thoughts:  No  Homicidal Thoughts:  No  Memory:  Immediate;   Fair Recent;   Fair Remote;   Fair  Judgement:  Fair  Insight:  Fair  Psychomotor Activity:  Normal  Concentration:  Fair  Recall:  [employer] of Knowledge:Poor  Language: Good  Akathisia:  NA  Handed:  Right  AIMS (if indicated):     Assets:  Communication Skills Desire for Improvement  Sleep:  Number of Hours: 8.75  Cognition: WNL  ADL's:  Intact   Mental Status Per Nursing Assessment::   On Admission:  NA  Demographic Factors:  Male  Loss Factors: Decrease in vocational status  Historical Factors: NA  Risk Reduction Factors:   Living with another person, especially a relative and Positive social support  Continued Clinical Symptoms:  Previous Psychiatric Diagnoses and Treatments  Cognitive Features That Contribute To Risk:  None    Suicide Risk:  Minimal: No identifiable suicidal ideation.  Patients presenting with no risk factors but with morbid ruminations; may be classified as minimal risk based on the severity of the depressive  symptoms    Plan Of Care/Follow-up recommendations:  Activity:  full  Jahmarion Popoff, MD 04/09/2019, 8:19 AM

## 2019-04-09 NOTE — Progress Notes (Signed)
Recreation Therapy Notes  Date: 10.30.20 Time: 1000 Location:  500 [address] Dayroom  Group Topic: Communication, Team Building, Problem Solving  Goal Area(s) Addresses:  Patient will effectively work with peer towards shared goal.  Patient will identify skills used to make activity successful.  Patient will identify how skills used during activity can be used to reach post d/c goals.   Behavioral Response: Engaged  Intervention: STEM Activity  Activity: Straw Bridge.  Patients were divided into groups.  Patients were given 15 straws and a long piece of masking tape.  Patients were to build an elevated bridge that could hold a small puzzle box.  Education:Social Skills, Discharge Planning   Education Outcome: Acknowledges education/In group clarification offered/Needs additional education.   Clinical Observations/Feedback:  Pt was active and engaged.  Pt seemed to take on the leadership role of the group.  Pt listened to the suggestions of his peers.  Pt was appropriate and able to focus on activity.    Victorino Sparrow, LRT/CTRS      Victorino Sparrow A 04/09/2019 12:09 PM

## 2019-04-09 NOTE — Tx Team (Signed)
Interdisciplinary Treatment and Diagnostic Plan Update  04/09/2019 Time of Session: 10:15am Wesley Morales MRN: [id number]  Principal Diagnosis: Schizophrenia [facility])  Secondary Diagnoses: Principal Problem:   Schizophrenia (HCC) Active Problems:   MDD (major depressive disorder)   Current Medications:  Current Facility-Administered Medications  Medication Dose Route Frequency Provider Last Rate Last Dose  . acetaminophen (TYLENOL) tablet 650 mg  650 mg Oral Q6H PRN Anike, Adaku C, NP      . alum & mag hydroxide-simeth (MAALOX/MYLANTA) 200-200-20 MG/5ML suspension 30 mL  30 mL Oral Q4H PRN Anike, Adaku C, NP      . benztropine (COGENTIN) tablet 0.5 mg  0.5 mg Oral BID Malvin Johns, MD   0.5 mg at 04/09/19 0846  . hydrOXYzine (ATARAX/VISTARIL) tablet 25 mg  25 mg Oral TID PRN Anike, Adaku C, NP   25 mg at 04/08/19 2244  . magnesium hydroxide (MILK OF MAGNESIA) suspension 30 mL  30 mL Oral Daily PRN Anike, Adaku C, NP      . omega-3 acid ethyl esters (LOVAZA) capsule 1 g  1 g Oral BID Malvin Johns, MD   1 g at 04/09/19 0846  . risperiDONE (RISPERDAL) tablet 3 mg  3 mg Oral BID Malvin Johns, MD   3 mg at 04/09/19 0846  . traZODone (DESYREL) tablet 150 mg  150 mg Oral QHS Malvin Johns, MD   150 mg at 04/08/19 2243   Current Outpatient Medications  Medication Sig Dispense Refill  . benztropine (COGENTIN) 0.5 MG tablet Take 1 tablet (0.5 mg total) by mouth 2 (two) times daily. 60 tablet 3  . Dietary Management Product (ENLYTE) CAPS 1 a day maye get from [email or web address] 30 capsule 11  . mirtazapine (REMERON) 15 MG tablet Take 15 mg by mouth at bedtime.    Marland Kitchen omega-3 acid ethyl esters (LOVAZA) 1 g capsule Take 1 capsule (1 g total) by mouth 2 (two) times daily. 60 capsule 6  . risperiDONE (RISPERDAL) 4 MG tablet Take 1 tablet (4 mg total) by mouth 2 (two) times daily. 30 tablet 1   PTA Medications: No medications prior to admission.    Patient Stressors: Substance abuse  Patient Strengths:  Ability for insight  Treatment Modalities: Medication Management, Group therapy, Case management,  1 to 1 session with clinician, Psychoeducation, Recreational therapy.   Physician Treatment Plan for Primary Diagnosis: Schizophrenia (HCC) Long Term Goal(s): Improvement in symptoms so as ready for discharge Improvement in symptoms so as ready for discharge   Short Term Goals: Ability to identify changes in lifestyle to reduce recurrence of condition will improve Ability to verbalize feelings will improve Ability to demonstrate self-control will improve Ability to identify and develop effective coping behaviors will improve Compliance with prescribed medications will improve Ability to identify triggers associated with substance abuse/mental health issues will improve  Medication Management: Evaluate patient's response, side effects, and tolerance of medication regimen.  Therapeutic Interventions: 1 to 1 sessions, Unit Group sessions and Medication administration.  Evaluation of Outcomes: Adequate for Discharge  Physician Treatment Plan for Secondary Diagnosis: Principal Problem:   Schizophrenia (HCC) Active Problems:   MDD (major depressive disorder)  Long Term Goal(s): Improvement in symptoms so as ready for discharge Improvement in symptoms so as ready for discharge   Short Term Goals: Ability to identify changes in lifestyle to reduce recurrence of condition will improve Ability to verbalize feelings will improve Ability to demonstrate self-control will improve Ability to identify and develop effective coping behaviors will improve Compliance with  prescribed medications will improve Ability to identify triggers associated with substance abuse/mental health issues will improve     Medication Management: Evaluate patient's response, side effects, and tolerance of medication regimen.  Therapeutic Interventions: 1 to 1 sessions, Unit Group sessions and Medication  administration.  Evaluation of Outcomes: Adequate for Discharge   RN Treatment Plan for Primary Diagnosis: Schizophrenia ([place]) Long Term Goal(s): Knowledge of disease and therapeutic regimen to maintain health will improve  Short Term Goals: Ability to participate in decision making will improve, Ability to verbalize feelings will improve, Ability to disclose and discuss suicidal ideas, Ability to identify and develop effective coping behaviors will improve and Compliance with prescribed medications will improve  Medication Management: RN will administer medications as ordered by provider, will assess and evaluate patient's response and provide education to patient for prescribed medication. RN will report any adverse and/or side effects to prescribing provider.  Therapeutic Interventions: 1 on 1 counseling sessions, Psychoeducation, Medication administration, Evaluate responses to treatment, Monitor vital signs and CBGs as ordered, Perform/monitor CIWA, COWS, AIMS and Fall Risk screenings as ordered, Perform wound care treatments as ordered.  Evaluation of Outcomes: Adequate for Discharge   LCSW Treatment Plan for Primary Diagnosis: Schizophrenia [facility]) Long Term Goal(s): Safe transition to appropriate next level of care at discharge, Engage patient in therapeutic group addressing interpersonal concerns.  Short Term Goals: Engage patient in aftercare planning with referrals and resources and Increase skills for wellness and recovery  Therapeutic Interventions: Assess for all discharge needs, 1 to 1 time with Social worker, Explore available resources and support systems, Assess for adequacy in community support network, Educate family and significant other(s) on suicide prevention, Complete Psychosocial Assessment, Interpersonal group therapy.  Evaluation of Outcomes: Adequate for Discharge   Progress in Treatment: Attending groups: Yes. Participating in groups: No. Taking medication as  prescribed: Yes. Toleration medication: Yes. Family/Significant other contact made: Yes, individual(s) contacted:  pt's mother Patient understands diagnosis: Yes. Discussing patient identified problems/goals with staff: Yes. Medical problems stabilized or resolved: Yes. Denies suicidal/homicidal ideation: Yes. Issues/concerns per patient self-inventory: No. Other:   New problem(s) identified: No, Describe:  None  New Short Term/Long Term Goal(s): Medication stabilization, elimination of SI thoughts, and development of a comprehensive mental wellness plan.   Patient Goals:    Discharge Plan or Barriers: Patient discharged home with his mother and is following with [facility]   Reason for Continuation of Hospitalization: Patient is discharging today.   Estimated Length of Stay: Patient is discharging today.   Attendees: Patient: 04/09/2019   Physician: Dr. Johnn Hai, MD 04/09/2019   Nursing: Louanne Skye, RN 04/09/2019   RN Care Manager: 04/09/2019   Social Worker: Ardelle Anton, LCSW 04/09/2019   Recreational Therapist:  04/09/2019   Other: Ovidio Kin, MSW Intern 04/09/2019   Other:  04/09/2019  Other: 04/09/2019      Scribe for Treatment Team: Trecia Rogers, LCSW 04/09/2019 4:16 PM

## 2019-04-09 NOTE — Plan of Care (Signed)
Patient to be discharged today per MD order 

## 2019-04-09 NOTE — Progress Notes (Signed)
  [facility] Case Management Discharge Plan :  Will you be returning to the same living situation after discharge:  Yes,  home  At discharge, do you have transportation home?: Yes,  pt's mother  Do you have the ability to pay for your medications: Yes,  has blue cross blue shield   Release of information consent forms completed and in the chart;  Patient's signature needed at discharge.  Patient to Follow up at: Follow-up Information    BEHAVIORAL HEALTH OUTPATIENT THERAPY [place] Follow up on 04/20/2019.   Specialty: Behavioral Health Why: Therapy with Oneita Kras is Tuesday 11/10 at 3:00p.  Medication management with Dr. Mamie Nick is Friday 11/20 at 9:00a.  Appts will be virtual.  Contact information: [place] [phone] [place] [phone]          Next level of care provider has access to [place] and Suicide Prevention discussed: Yes,  pt's mother      Has patient been referred to the Quitline?: Yes, faxed on 04/09/2019  Patient has been referred for addiction treatment: Yes  Billey Chang, Student-Social Work 04/09/2019, 9:56 AM

## 2019-04-09 NOTE — Plan of Care (Signed)
Pt was able to show improved communication skills during recreation therapy group session.    Victorino Sparrow, LRT/CTRS

## 2019-04-20 ENCOUNTER — Ambulatory Visit (HOSPITAL_COMMUNITY): Payer: BC Managed Care – PPO | Admitting: Licensed Clinical Social Worker

## 2019-04-20 ENCOUNTER — Other Ambulatory Visit: Payer: Self-pay

## 2019-04-20 ENCOUNTER — Telehealth (HOSPITAL_COMMUNITY): Payer: Self-pay | Admitting: Licensed Clinical Social Worker

## 2019-04-28 ENCOUNTER — Other Ambulatory Visit: Payer: Self-pay

## 2019-04-28 ENCOUNTER — Ambulatory Visit (HOSPITAL_COMMUNITY): Payer: BC Managed Care – PPO | Admitting: Psychiatry

## 2019-04-30 ENCOUNTER — Ambulatory Visit (HOSPITAL_COMMUNITY): Payer: BC Managed Care – PPO | Admitting: Psychiatry

## 2020-01-27 ENCOUNTER — Emergency Department (HOSPITAL_COMMUNITY): Payer: Managed Care, Other (non HMO)

## 2020-01-27 ENCOUNTER — Emergency Department (HOSPITAL_COMMUNITY)
Admission: EM | Admit: 2020-01-27 | Discharge: 2020-02-09 | Disposition: E | Payer: Managed Care, Other (non HMO) | Attending: Emergency Medicine | Admitting: Emergency Medicine

## 2020-01-27 DIAGNOSIS — W3400XA Accidental discharge from unspecified firearms or gun, initial encounter: Secondary | ICD-10-CM

## 2020-01-27 DIAGNOSIS — S066X9A Traumatic subarachnoid hemorrhage with loss of consciousness of unspecified duration, initial encounter: Secondary | ICD-10-CM

## 2020-01-27 DIAGNOSIS — S066X1A Traumatic subarachnoid hemorrhage with loss of consciousness of 30 minutes or less, initial encounter: Secondary | ICD-10-CM | POA: Insufficient documentation

## 2020-01-27 DIAGNOSIS — Y939 Activity, unspecified: Secondary | ICD-10-CM | POA: Insufficient documentation

## 2020-01-27 DIAGNOSIS — Y929 Unspecified place or not applicable: Secondary | ICD-10-CM | POA: Insufficient documentation

## 2020-01-27 DIAGNOSIS — Y999 Unspecified external cause status: Secondary | ICD-10-CM | POA: Insufficient documentation

## 2020-01-27 DIAGNOSIS — R000 Tachycardia, unspecified: Secondary | ICD-10-CM | POA: Diagnosis not present

## 2020-01-27 DIAGNOSIS — S0190XA Unspecified open wound of unspecified part of head, initial encounter: Secondary | ICD-10-CM | POA: Diagnosis not present

## 2020-01-27 DIAGNOSIS — S0990XA Unspecified injury of head, initial encounter: Secondary | ICD-10-CM | POA: Diagnosis present

## 2020-01-27 LAB — I-STAT CHEM 8, ED
BUN: 5 mg/dL — ABNORMAL LOW (ref 6–20)
Calcium, Ion: 1.05 mmol/L — ABNORMAL LOW (ref 1.15–1.40)
Chloride: 103 mmol/L (ref 98–111)
Creatinine, Ser: 0.9 mg/dL (ref 0.61–1.24)
Glucose, Bld: 244 mg/dL — ABNORMAL HIGH (ref 70–99)
HCT: 36 % — ABNORMAL LOW (ref 39.0–52.0)
Hemoglobin: 12.2 g/dL — ABNORMAL LOW (ref 13.0–17.0)
Potassium: 2.9 mmol/L — ABNORMAL LOW (ref 3.5–5.1)
Sodium: 139 mmol/L (ref 135–145)
TCO2: 20 mmol/L — ABNORMAL LOW (ref 22–32)

## 2020-01-27 LAB — CBC
HCT: 36.5 % — ABNORMAL LOW (ref 39.0–52.0)
Hemoglobin: 11.8 g/dL — ABNORMAL LOW (ref 13.0–17.0)
MCH: 30.6 pg (ref 26.0–34.0)
MCHC: 32.3 g/dL (ref 30.0–36.0)
MCV: 94.6 fL (ref 80.0–100.0)
Platelets: 260 10*3/uL (ref 150–400)
RBC: 3.86 MIL/uL — ABNORMAL LOW (ref 4.22–5.81)
RDW: 13.4 % (ref 11.5–15.5)
WBC: 9.1 10*3/uL (ref 4.0–10.5)
nRBC: 0 % (ref 0.0–0.2)

## 2020-01-27 LAB — COMPREHENSIVE METABOLIC PANEL
ALT: 13 U/L (ref 0–44)
AST: 33 U/L (ref 15–41)
Albumin: 3.9 g/dL (ref 3.5–5.0)
Alkaline Phosphatase: 37 U/L — ABNORMAL LOW (ref 38–126)
Anion gap: 13 (ref 5–15)
BUN: 5 mg/dL — ABNORMAL LOW (ref 6–20)
CO2: 18 mmol/L — ABNORMAL LOW (ref 22–32)
Calcium: 8.5 mg/dL — ABNORMAL LOW (ref 8.9–10.3)
Chloride: 105 mmol/L (ref 98–111)
Creatinine, Ser: 1.14 mg/dL (ref 0.61–1.24)
GFR calc Af Amer: >60 mL/min (ref >60)
GFR calc non Af Amer: >60 mL/min (ref >60)
Glucose, Bld: 246 mg/dL — ABNORMAL HIGH (ref 70–99)
Potassium: 2.9 mmol/L — ABNORMAL LOW (ref 3.5–5.1)
Sodium: 136 mmol/L (ref 135–145)
Total Bilirubin: 1 mg/dL (ref 0.3–1.2)
Total Protein: 5.8 g/dL — ABNORMAL LOW (ref 6.5–8.1)

## 2020-01-27 LAB — PROTIME-INR
INR: 1.9 — ABNORMAL HIGH (ref 0.8–1.2)
Prothrombin Time: 21.3 seconds — ABNORMAL HIGH (ref 11.4–15.2)

## 2020-01-27 LAB — ETHANOL: Alcohol, Ethyl (B): <10 mg/dL (ref <10)

## 2020-01-27 LAB — SAMPLE TO BLOOD BANK

## 2020-01-27 LAB — LACTIC ACID, PLASMA: Lactic Acid, Venous: 3.1 mmol/L (ref 0.5–1.9)

## 2020-01-27 MED ORDER — NOREPINEPHRINE BITARTRATE 1 MG/ML IV SOLN: INTRAVENOUS | Status: AC | PRN

## 2020-01-27 MED ORDER — ROCURONIUM BROMIDE 50 MG/5ML IV SOLN
100.0000 mg | Freq: Once | INTRAVENOUS | Status: AC
  Administered 2020-01-27: 100 mg via INTRAVENOUS
  Filled 2020-01-27: qty 10

## 2020-01-27 MED ORDER — EPINEPHRINE 1 MG/10ML IJ SOSY
PREFILLED_SYRINGE | INTRAMUSCULAR | Status: AC | PRN
  Administered 2020-01-27 (×2): 0.5 mg via INTRAVENOUS

## 2020-01-27 MED ORDER — PROPOFOL 1000 MG/100ML IV EMUL
5.0000 ug/kg/min | INTRAVENOUS | Status: DC
  Administered 2020-01-27: 50 ug/kg/min via INTRAVENOUS
  Administered 2020-01-27: 5 ug/kg/min via INTRAVENOUS

## 2020-01-27 MED ORDER — PHENYLEPHRINE HCL-NACL 10-0.9 MG/250ML-% IV SOLN
0.0000 ug/min | INTRAVENOUS | Status: DC
  Administered 2020-01-27: 30 ug/min via INTRAVENOUS

## 2020-01-27 MED ORDER — MIDAZOLAM HCL 5 MG/5ML IJ SOLN
INTRAMUSCULAR | Status: AC | PRN
  Administered 2020-01-27: 2 mg via INTRAVENOUS

## 2020-01-27 MED ORDER — PROPOFOL 1000 MG/100ML IV EMUL
INTRAVENOUS | Status: AC
  Filled 2020-01-27: qty 100

## 2020-01-27 MED ORDER — EPINEPHRINE 1 MG/10ML IJ SOSY
PREFILLED_SYRINGE | INTRAMUSCULAR | Status: AC | PRN
  Administered 2020-01-27: 1 via INTRAVENOUS
  Administered 2020-01-27: .5 via INTRAVENOUS

## 2020-01-27 MED ORDER — SODIUM CHLORIDE 0.9 % IV SOLN
INTRAVENOUS | Status: AC | PRN
  Administered 2020-01-27: 1000 mL via INTRAVENOUS

## 2020-01-27 MED ORDER — PHENYLEPHRINE HCL-NACL 10-0.9 MG/250ML-% IV SOLN
INTRAVENOUS | Status: AC
  Filled 2020-01-27: qty 250

## 2020-01-27 MED ORDER — PHENYLEPHRINE 40 MCG/ML (10ML) SYRINGE FOR IV PUSH (FOR BLOOD PRESSURE SUPPORT)
PREFILLED_SYRINGE | INTRAVENOUS | Status: AC | PRN
  Administered 2020-01-27: 120 ug via INTRAVENOUS
  Administered 2020-01-27: 80 ug via INTRAVENOUS

## 2020-01-27 MED ORDER — FENTANYL CITRATE (PF) 100 MCG/2ML IJ SOLN
INTRAMUSCULAR | Status: AC | PRN
  Administered 2020-01-27: 100 ug via INTRAVENOUS

## 2020-01-27 MED ORDER — PHENYLEPHRINE HCL (PRESSORS) 10 MG/ML IV SOLN: INTRAVENOUS | Status: AC | PRN

## 2020-01-27 NOTE — ED Notes (Signed)
Medical Examiner paged to Dr. Cora Daniels per his request

## 2020-01-27 NOTE — Progress Notes (Signed)
Mother voiced that she no longer wants to give medication to support his blood pressure. Family was informed of the decision.Family present at bedside. Wesley Morales D

## 2020-01-27 NOTE — Progress Notes (Signed)
CDS called. Referral [phone]
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
## 2020-01-27 NOTE — Progress Notes (Signed)
Family at bedside. Dr. Dwain Sarna speaking with family about plan of care. Ripley Fraise D
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
## 2020-01-28 LAB — BPAM RBC
Blood Product Expiration Date: 202109112359
Blood Product Expiration Date: 202109122359
ISSUE DATE / TIME: 202108192040
ISSUE DATE / TIME: 202108192040
Unit Type and Rh: 5100
Unit Type and Rh: 5100

## 2020-01-28 LAB — TYPE AND SCREEN
ABO/RH(D): B POS
Antibody Screen: NEGATIVE
Unit division: 0
Unit division: 0

## 2020-01-28 LAB — BPAM FFP
Blood Product Expiration Date: 202108272359
Blood Product Expiration Date: 202108302359
ISSUE DATE / TIME: 202108192041
ISSUE DATE / TIME: 202108192041
Unit Type and Rh: 6200
Unit Type and Rh: 6200

## 2020-01-28 LAB — PREPARE FRESH FROZEN PLASMA
Unit division: 0
Unit division: 0

## 2020-01-28 MED FILL — Medication: Qty: 1 | Status: AC

## 2020-01-28 NOTE — ED Notes (Signed)
Per mothers request, pt was extubated by this RN at this time. MD Dwain Sarna gave verbal order for comfort measures per wishes. MD Dwain Sarna made aware pt was extubated at this time

## 2020-01-28 NOTE — ED Notes (Signed)
TIME OF DEATH CALLED BY MD [facility] AT 09-29-22. FAMILY AT BEDSIDE AT TIME OF PASSING.

## 2020-01-29 LAB — CDS SEROLOGY

## 2020-02-09 DEATH — deceased
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
